# Patient Record
Sex: Female | Born: 2000 | Race: White | Hispanic: No | Marital: Single | Smoking: Never smoker
Health system: Southern US, Community
[De-identification: ages and names within clinical notes are randomized; demographics above are authoritative.]

## PROBLEM LIST (undated history)

## (undated) DIAGNOSIS — A749 Chlamydial infection, unspecified: Secondary | ICD-10-CM

## (undated) DIAGNOSIS — T7840XA Allergy, unspecified, initial encounter: Secondary | ICD-10-CM

## (undated) DIAGNOSIS — O139 Gestational [pregnancy-induced] hypertension without significant proteinuria, unspecified trimester: Secondary | ICD-10-CM

## (undated) DIAGNOSIS — O4290 Premature rupture of membranes, unspecified as to length of time between rupture and onset of labor, unspecified weeks of gestation: Secondary | ICD-10-CM

## (undated) DIAGNOSIS — N390 Urinary tract infection, site not specified: Secondary | ICD-10-CM

## (undated) DIAGNOSIS — N730 Acute parametritis and pelvic cellulitis: Secondary | ICD-10-CM

## (undated) DIAGNOSIS — IMO0002 Reserved for concepts with insufficient information to code with codable children: Secondary | ICD-10-CM

## (undated) HISTORY — DX: Acute parametritis and pelvic cellulitis: N73.0

## (undated) HISTORY — PX: COLONOSCOPY: SHX174

## (undated) HISTORY — DX: Chlamydial infection, unspecified: A74.9

## (undated) HISTORY — PX: WISDOM TOOTH EXTRACTION: SHX21

## (undated) HISTORY — DX: Gestational (pregnancy-induced) hypertension without significant proteinuria, unspecified trimester: O13.9

## (undated) HISTORY — PX: TONSILLECTOMY: SUR1361

---
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
## 2019-01-05 ENCOUNTER — Ambulatory Visit (INDEPENDENT_AMBULATORY_CARE_PROVIDER_SITE_OTHER): Payer: Medicaid Other | Admitting: Maternal Newborn

## 2019-01-05 ENCOUNTER — Encounter: Payer: Self-pay | Admitting: Maternal Newborn

## 2019-01-05 ENCOUNTER — Other Ambulatory Visit: Payer: Self-pay

## 2019-01-05 VITALS — BP 120/70 | Wt 214.0 lb

## 2019-01-05 DIAGNOSIS — Z3A33 33 weeks gestation of pregnancy: Secondary | ICD-10-CM

## 2019-01-05 DIAGNOSIS — Z3403 Encounter for supervision of normal first pregnancy, third trimester: Secondary | ICD-10-CM

## 2019-01-05 DIAGNOSIS — Z3402 Encounter for supervision of normal first pregnancy, second trimester: Secondary | ICD-10-CM

## 2019-01-05 LAB — POCT URINALYSIS DIPSTICK OB: Glucose, UA: NEGATIVE

## 2019-01-05 NOTE — Patient Instructions (Signed)
Third Trimester of Pregnancy The third trimester is from week 28 through week 40 (months 7 through 9). The third trimester is a time when the unborn baby (fetus) is growing rapidly. At the end of the ninth month, the fetus is about 20 inches in length and weighs 6-10 pounds. Body changes during your third trimester Your body will continue to go through many changes during pregnancy. The changes vary from woman to woman. During the third trimester:  Your weight will continue to increase. You can expect to gain 25-35 pounds (11-16 kg) by the end of the pregnancy.  You may begin to get stretch marks on your hips, abdomen, and breasts.  You may urinate more often because the fetus is moving lower into your pelvis and pressing on your bladder.  You may develop or continue to have heartburn. This is caused by increased hormones that slow down muscles in the digestive tract.  You may develop or continue to have constipation because increased hormones slow digestion and cause the muscles that push waste through your intestines to relax.  You may develop hemorrhoids. These are swollen veins (varicose veins) in the rectum that can itch or be painful.  You may develop swollen, bulging veins (varicose veins) in your legs.  You may have increased body aches in the pelvis, back, or thighs. This is due to weight gain and increased hormones that are relaxing your joints.  You may have changes in your hair. These can include thickening of your hair, rapid growth, and changes in texture. Some women also have hair loss during or after pregnancy, or hair that feels dry or thin. Your hair will most likely return to normal after your baby is born.  Your breasts will continue to grow and they will continue to become tender. A yellow fluid (colostrum) may leak from your breasts. This is the first milk you are producing for your baby.  Your belly button may stick out.  You may notice more swelling in your hands,  face, or ankles.  You may have increased tingling or numbness in your hands, arms, and legs. The skin on your belly may also feel numb.  You may feel short of breath because of your expanding uterus.  You may have more problems sleeping. This can be caused by the size of your belly, increased need to urinate, and an increase in your body's metabolism.  You may notice the fetus "dropping," or moving lower in your abdomen (lightening).  You may have increased vaginal discharge.  You may notice your joints feel loose and you may have pain around your pelvic bone. What to expect at prenatal visits You will have prenatal exams every 2 weeks until week 36. Then you will have weekly prenatal exams. During a routine prenatal visit:  You will be weighed to make sure you and the baby are growing normally.  Your blood pressure will be taken.  Your abdomen will be measured to track your baby's growth.  The fetal heartbeat will be listened to.  Any test results from the previous visit will be discussed.  You may have a cervical check near your due date to see if your cervix has softened or thinned (effaced).  You will be tested for Group B streptococcus. This happens between 35 and 37 weeks. Your health care provider may ask you:  What your birth plan is.  How you are feeling.  If you are feeling the baby move.  If you have had any abnormal   symptoms, such as leaking fluid, bleeding, severe headaches, or abdominal cramping.  If you are using any tobacco products, including cigarettes, chewing tobacco, and electronic cigarettes.  If you have any questions. Other tests or screenings that may be performed during your third trimester include:  Blood tests that check for low iron levels (anemia).  Fetal testing to check the health, activity level, and growth of the fetus. Testing is done if you have certain medical conditions or if there are problems during the pregnancy.  Nonstress test  (NST). This test checks the health of your baby to make sure there are no signs of problems, such as the baby not getting enough oxygen. During this test, a belt is placed around your belly. The baby is made to move, and its heart rate is monitored during movement. What is false labor? False labor is a condition in which you feel small, irregular tightenings of the muscles in the womb (contractions) that usually go away with rest, changing position, or drinking water. These are called Braxton Hicks contractions. Contractions may last for hours, days, or even weeks before true labor sets in. If contractions come at regular intervals, become more frequent, increase in intensity, or become painful, you should see your health care provider. What are the signs of labor?  Abdominal cramps.  Regular contractions that start at 10 minutes apart and become stronger and more frequent with time.  Contractions that start on the top of the uterus and spread down to the lower abdomen and back.  Increased pelvic pressure and dull back pain.  A watery or bloody mucus discharge that comes from the vagina.  Leaking of amniotic fluid. This is also known as your "water breaking." It could be a slow trickle or a gush. Let your health care provider know if it has a color or strange odor. If you have any of these signs, call your health care provider right away, even if it is before your due date. Follow these instructions at home: Medicines  Follow your health care provider's instructions regarding medicine use. Specific medicines may be either safe or unsafe to take during pregnancy.  Take a prenatal vitamin that contains at least 600 micrograms (mcg) of folic acid.  If you develop constipation, try taking a stool softener if your health care provider approves. Eating and drinking   Eat a balanced diet that includes fresh fruits and vegetables, whole grains, good sources of protein such as meat, eggs, or tofu,  and low-fat dairy. Your health care provider will help you determine the amount of weight gain that is right for you.  Avoid raw meat and uncooked cheese. These carry germs that can cause birth defects in the baby.  If you have low calcium intake from food, talk to your health care provider about whether you should take a daily calcium supplement.  Eat four or five small meals rather than three large meals a day.  Limit foods that are high in fat and processed sugars, such as fried and sweet foods.  To prevent constipation: ? Drink enough fluid to keep your urine clear or pale yellow. ? Eat foods that are high in fiber, such as fresh fruits and vegetables, whole grains, and beans. Activity  Exercise only as directed by your health care provider. Most women can continue their usual exercise routine during pregnancy. Try to exercise for 30 minutes at least 5 days a week. Stop exercising if you experience uterine contractions.  Avoid heavy lifting.  Do   not exercise in extreme heat or humidity, or at high altitudes.  Wear low-heel, comfortable shoes.  Practice good posture.  You may continue to have sex unless your health care provider tells you otherwise. Relieving pain and discomfort  Take frequent breaks and rest with your legs elevated if you have leg cramps or low back pain.  Take warm sitz baths to soothe any pain or discomfort caused by hemorrhoids. Use hemorrhoid cream if your health care provider approves.  Wear a good support bra to prevent discomfort from breast tenderness.  If you develop varicose veins: ? Wear support pantyhose or compression stockings as told by your healthcare provider. ? Elevate your feet for 15 minutes, 3-4 times a day. Prenatal care  Write down your questions. Take them to your prenatal visits.  Keep all your prenatal visits as told by your health care provider. This is important. Safety  Wear your seat belt at all times when driving.  Make  a list of emergency phone numbers, including numbers for family, friends, the hospital, and police and fire departments. General instructions  Avoid cat litter boxes and soil used by cats. These carry germs that can cause birth defects in the baby. If you have a cat, ask someone to clean the litter box for you.  Do not travel far distances unless it is absolutely necessary and only with the approval of your health care provider.  Do not use hot tubs, steam rooms, or saunas.  Do not drink alcohol.  Do not use any products that contain nicotine or tobacco, such as cigarettes and e-cigarettes. If you need help quitting, ask your health care provider.  Do not use any medicinal herbs or unprescribed drugs. These chemicals affect the formation and growth of the baby.  Do not douche or use tampons or scented sanitary pads.  Do not cross your legs for long periods of time.  To prepare for the arrival of your baby: ? Take prenatal classes to understand, practice, and ask questions about labor and delivery. ? Make a trial run to the hospital. ? Visit the hospital and tour the maternity area. ? Arrange for maternity or paternity leave through employers. ? Arrange for family and friends to take care of pets while you are in the hospital. ? Purchase a rear-facing car seat and make sure you know how to install it in your car. ? Pack your hospital bag. ? Prepare the baby's nursery. Make sure to remove all pillows and stuffed animals from the baby's crib to prevent suffocation.  Visit your dentist if you have not gone during your pregnancy. Use a soft toothbrush to brush your teeth and be gentle when you floss. Contact a health care provider if:  You are unsure if you are in labor or if your water has broken.  You become dizzy.  You have mild pelvic cramps, pelvic pressure, or nagging pain in your abdominal area.  You have lower back pain.  You have persistent nausea, vomiting, or diarrhea.   You have an unusual or bad smelling vaginal discharge.  You have pain when you urinate. Get help right away if:  Your water breaks before 37 weeks.  You have regular contractions less than 5 minutes apart before 37 weeks.  You have a fever.  You are leaking fluid from your vagina.  You have spotting or bleeding from your vagina.  You have severe abdominal pain or cramping.  You have rapid weight loss or weight gain.  You have   shortness of breath with chest pain.  You notice sudden or extreme swelling of your face, hands, ankles, feet, or legs.  Your baby makes fewer than 10 movements in 2 hours.  You have severe headaches that do not go away when you take medicine.  You have vision changes. Summary  The third trimester is from week 28 through week 40, months 7 through 9. The third trimester is a time when the unborn baby (fetus) is growing rapidly.  During the third trimester, your discomfort may increase as you and your baby continue to gain weight. You may have abdominal, leg, and back pain, sleeping problems, and an increased need to urinate.  During the third trimester your breasts will keep growing and they will continue to become tender. A yellow fluid (colostrum) may leak from your breasts. This is the first milk you are producing for your baby.  False labor is a condition in which you feel small, irregular tightenings of the muscles in the womb (contractions) that eventually go away. These are called Braxton Hicks contractions. Contractions may last for hours, days, or even weeks before true labor sets in.  Signs of labor can include: abdominal cramps; regular contractions that start at 10 minutes apart and become stronger and more frequent with time; watery or bloody mucus discharge that comes from the vagina; increased pelvic pressure and dull back pain; and leaking of amniotic fluid. This information is not intended to replace advice given to you by your health  care provider. Make sure you discuss any questions you have with your health care provider. Document Released: 05/01/2001 Document Revised: 08/28/2018 Document Reviewed: 06/12/2016 [employer] Patient Education  2020 [employer]  

## 2019-01-05 NOTE — Progress Notes (Signed)
    Routine Prenatal Care Visit  Subjective  Leslie Galvan is a 18 y.o. G1P0 at [redacted]w[redacted]d being seen today for ongoing prenatal care.  She is currently monitored for the following issues for this low-risk pregnancy and has Encounter for supervision of normal first pregnancy in second trimester on their problem list.  ----------------------------------------------------------------------------------- Patient reports no complaints.   Contractions: Not present. Vag. Bleeding: None.  Movement: Present. No leaking of fluid.  ----------------------------------------------------------------------------------- The following portions of the patient's history were reviewed and updated as appropriate: allergies, current medications, past family history, past medical history, past social history, past surgical history and problem list. Problem list updated.   Objective  Blood pressure 120/70, weight 214 lb (97.1 kg), last menstrual period 05/13/2018. Pregravid weight 170 lb (77.1 kg) Total Weight Gain 44 lb (20 kg) Urinalysis: Urine dipstick shows negative for glucose, positive for protein (trace). Fetal Status: Fetal Heart Rate (bpm): 144 Fundal Height: 34 cm Movement: Present     General:  Alert, oriented and cooperative. Patient is in no acute distress.  Skin: Skin is warm and dry. No rash noted.   Cardiovascular: Normal heart rate noted  Respiratory: Normal respiratory effort, no problems with respiration noted  Abdomen: Soft, gravid, appropriate for gestational age. Pain/Pressure: Absent     Pelvic:  Cervical exam deferred        Extremities: Normal range of motion.  Edema: None  Mental Status: Normal mood and affect. Normal behavior. Normal judgment and thought content.     Assessment   18 y.o. G1P0 at [redacted]w[redacted]d, EDD 02/17/2019 by Last Menstrual Period presenting for a routine prenatal visit.  Plan   Pregnancy #1 Problems (from 09/29/18 to present)    Problem Noted Resolved   Encounter for  supervision of normal first pregnancy in second trimester 09/29/2018 by Rexene Agent, CNM No   Overview Addendum 12/08/2018 10:20 AM by Rod Can, [place] Prenatal Labs  Dating L=8 Blood type: A/Positive/-- (01/28 0000)   Genetic Screen Normal NT scan per OB records, no AFP on file and patient declined 5/11 Antibody:Negative (01/28 0000)  Anatomic [place] Complete 7/6 Rubella: Immune (01/28 0000) Varicella:    GTT Third trimester:  RPR: Nonreactive (01/28 0000)   Rhogam N/A HBsAg: Negative (01/28 0000)   TDaP vaccine   Flu Shot: HIV: Neg (01/28 0000)   Baby Food Breast                         GBS:   Contraception Pill? Pap: N/A, less than 21  CBB     CS/VBAC NA   Support Person Twin sister Janan Halter                Please refer to After Visit Summary for other counseling recommendations.   Return in about 2 weeks (around 01/19/2019) for [place].  Avel Sensor, CNM 01/05/2019  11:25 AM

## 2019-01-19 ENCOUNTER — Encounter: Payer: Self-pay | Admitting: Obstetrics and Gynecology

## 2019-01-19 ENCOUNTER — Other Ambulatory Visit (HOSPITAL_COMMUNITY)
Admission: RE | Admit: 2019-01-19 | Discharge: 2019-01-19 | Disposition: A | Discharge status: Home / Self Care | Payer: Medicaid Other | Source: Ambulatory Visit | Attending: Obstetrics and Gynecology | Admitting: Obstetrics and Gynecology

## 2019-01-19 ENCOUNTER — Ambulatory Visit (INDEPENDENT_AMBULATORY_CARE_PROVIDER_SITE_OTHER): Payer: Medicaid Other | Admitting: Obstetrics and Gynecology

## 2019-01-19 ENCOUNTER — Observation Stay
Admission: EM | Admit: 2019-01-19 | Discharge: 2019-01-19 | Disposition: A | Discharge status: Home / Self Care | Payer: Medicaid Other | Attending: Obstetrics and Gynecology | Admitting: Obstetrics and Gynecology

## 2019-01-19 ENCOUNTER — Other Ambulatory Visit: Payer: Self-pay

## 2019-01-19 VITALS — BP 126/80 | Wt 215.0 lb

## 2019-01-19 DIAGNOSIS — O26853 Spotting complicating pregnancy, third trimester: Secondary | ICD-10-CM | POA: Insufficient documentation

## 2019-01-19 DIAGNOSIS — R1030 Lower abdominal pain, unspecified: Secondary | ICD-10-CM | POA: Insufficient documentation

## 2019-01-19 DIAGNOSIS — Z3A35 35 weeks gestation of pregnancy: Secondary | ICD-10-CM | POA: Insufficient documentation

## 2019-01-19 DIAGNOSIS — Z79899 Other long term (current) drug therapy: Secondary | ICD-10-CM | POA: Insufficient documentation

## 2019-01-19 DIAGNOSIS — O26843 Uterine size-date discrepancy, third trimester: Secondary | ICD-10-CM

## 2019-01-19 DIAGNOSIS — R300 Dysuria: Secondary | ICD-10-CM | POA: Diagnosis not present

## 2019-01-19 DIAGNOSIS — O26893 Other specified pregnancy related conditions, third trimester: Principal | ICD-10-CM

## 2019-01-19 DIAGNOSIS — R109 Unspecified abdominal pain: Secondary | ICD-10-CM

## 2019-01-19 DIAGNOSIS — M545 Low back pain: Secondary | ICD-10-CM | POA: Insufficient documentation

## 2019-01-19 DIAGNOSIS — Z3402 Encounter for supervision of normal first pregnancy, second trimester: Secondary | ICD-10-CM

## 2019-01-19 LAB — URINALYSIS, COMPLETE (UACMP) WITH MICROSCOPIC
Bilirubin Urine: NEGATIVE
Glucose, UA: NEGATIVE mg/dL
Ketones, ur: NEGATIVE mg/dL
Nitrite: NEGATIVE
Protein, ur: NEGATIVE mg/dL
Specific Gravity, Urine: 1.002 — ABNORMAL LOW (ref 1.005–1.030)
pH: 7 (ref 5.0–8.0)

## 2019-01-19 MED ORDER — CEPHALEXIN 500 MG PO CAPS: 500.0000 mg | ORAL_CAPSULE | Freq: Four times a day (QID) | ORAL | 0 refills | Status: AC

## 2019-01-19 MED ORDER — CEPHALEXIN 500 MG PO CAPS
500.0000 mg | ORAL_CAPSULE | Freq: Once | ORAL | Status: AC
  Administered 2019-01-19: 500 mg via ORAL
  Filled 2019-01-19: qty 1

## 2019-01-19 MED ORDER — PHENAZOPYRIDINE HCL 100 MG PO TABS
100.0000 mg | ORAL_TABLET | Freq: Once | ORAL | Status: AC
  Administered 2019-01-19: 100 mg via ORAL
  Filled 2019-01-19: qty 1

## 2019-01-19 NOTE — Progress Notes (Signed)
    Routine Prenatal Care Visit  Subjective  Leslie Galvan is a 18 y.o. G1P0 at [redacted]w[redacted]d being seen today for ongoing prenatal care.  She is currently monitored for the following issues for this low-risk pregnancy and has Encounter for supervision of normal first pregnancy in second trimester on their problem list.  ----------------------------------------------------------------------------------- Patient reports braxton hicks contractions 4-5 a day.   Contractions: Irregular. Vag. Bleeding: None.  Movement: Present. Denies leaking of fluid.  ----------------------------------------------------------------------------------- The following portions of the patient's history were reviewed and updated as appropriate: allergies, current medications, past family history, past medical history, past social history, past surgical history and problem list. Problem list updated.   Objective  Blood pressure 126/80, weight 215 lb (97.5 kg), last menstrual period 05/13/2018. Pregravid weight 170 lb (77.1 kg) Total Weight Gain 45 lb (20.4 kg) Urinalysis:      Fetal Status: Fetal Heart Rate (bpm): 140 Fundal Height: 38 cm Movement: Present     General:  Alert, oriented and cooperative. Patient is in no acute distress.  Skin: Skin is warm and dry. No rash noted.   Cardiovascular: Normal heart rate noted  Respiratory: Normal respiratory effort, no problems with respiration noted  Abdomen: Soft, gravid, appropriate for gestational age. Pain/Pressure: Present     Pelvic:  Cervical exam performed        Extremities: Normal range of motion.     Mental Status: Normal mood and affect. Normal behavior. Normal judgment and thought content.     Assessment   18 y.o. G1P0 at [redacted]w[redacted]d by  02/17/2019, by Last Menstrual Period presenting for routine prenatal visit  Plan   Pregnancy #1 Problems (from 09/29/18 to present)    Problem Noted Resolved   Encounter for supervision of normal first pregnancy in second  trimester 09/29/2018 by Rexene Agent, CNM No   Overview Addendum 12/08/2018 10:20 AM by Rod Can, [place] Prenatal Labs  Dating L=8 Blood type: A/Positive/-- (01/28 0000)   Genetic Screen Normal NT scan per OB records, no AFP on file and patient declined 5/11 Antibody:Negative (01/28 0000)  Anatomic [place] Complete 7/6 Rubella: Immune (01/28 0000) Varicella:    GTT Third trimester:  RPR: Nonreactive (01/28 0000)   Rhogam N/A HBsAg: Negative (01/28 0000)   TDaP vaccine   Flu Shot: HIV: Neg (01/28 0000)   Baby Food Breast                         GBS:   Contraception Pill? Pap: N/A, less than 21  CBB     CS/VBAC NA   Support Person Twin sister Janan Halter                 Gestational age appropriate obstetric precautions including but not limited to vaginal bleeding, contractions, leaking of fluid and fetal movement were reviewed in detail with the patient.    40 lb weight gain, Uterine size > dates, will obtain growth [place] Patient too uncomfortable with vaginal swabs for a SVE. Declined flu shot  Return in about 1 week (around 01/26/2019) for ROB and [place].  Homero Fellers MD [facility], [facility] 01/19/2019, 11:12 AM

## 2019-01-19 NOTE — Discharge Instructions (Signed)
LABOR: When contractions begin, you should start to time them from the beginning of one contraction to the beginning of the next.  When contractions are 5-10 minutes apart or less and have been regular for at least an hour, you should call your health care provider.  Notify your doctor if any of the following occur: 1. Bleeding from the vagina 7. Sudden, constant, or occasional abdominal pain  2. Pain or burning when urinating 8. Sudden gushing of fluid from the vagina (with or without continued leaking)  3. Chills or fever 9. Fainting spells, "black outs" or loss of consciousness  4. Increase in vaginal discharge 10. Severe or continued nausea or vomiting  5. Pelvic pressure (sudden increase) 11. Blurring of vision or spots before the eyes  6. Baby moving less than usual 12. Leaking of fluid    FETAL KICK COUNT: Lie on your left side for one hour after a meal, and count the number of times your baby kicks. If it is less than 5 times, get up, move around and drink some juice. Repeat the test 30 minutes later. If it is still less than 5 kicks in an hour, notify your doctor.Pregnancy and Urinary Tract Infection  A urinary tract infection (UTI) is an infection of any part of the urinary tract. This includes the kidneys, the tubes that connect your kidneys to your bladder (ureters), the bladder, and the tube that carries urine out of your body (urethra). These organs make, store, and get rid of urine in the body. Your health care provider may use other names to describe the infection. An upper UTI affects the ureters and kidneys (pyelonephritis). A lower UTI affects the bladder (cystitis) and urethra (urethritis). Most urinary tract infections are caused by bacteria in your genital area, around the entrance to your urinary tract (urethra). These bacteria grow and cause irritation and inflammation of your urinary tract. You are more likely to develop a UTI during pregnancy because the physical and hormonal  changes your body goes through can make it easier for bacteria to get into your urinary tract. Your growing baby also puts pressure on your bladder and can affect urine flow. It is important to recognize and treat UTIs in pregnancy because of the risk of serious complications for both you and your baby. How does this affect me? Symptoms of a UTI include:  Needing to urinate right away (urgently).  Frequent urination or passing small amounts of urine frequently.  Pain or burning with urination.  Blood in the urine.  Urine that smells bad or unusual.  Trouble urinating.  Cloudy urine.  Pain in the abdomen or lower back.  Vaginal discharge. You may also have:  Vomiting or a decreased appetite.  Confusion.  Irritability or tiredness.  A fever.  Diarrhea. How does this affect my baby? An untreated UTI during pregnancy could lead to a kidney infection or a systemic infection, which can cause health problems that could affect your baby. Possible complications of an untreated UTI include:  Giving birth to your baby before 37 weeks of pregnancy (premature).  Having a baby with a low birth weight.  Developing high blood pressure during pregnancy (preeclampsia).  Having a low hemoglobin level (anemia). What can I do to lower my risk? To prevent a UTI:  Go to the bathroom as soon as you feel the need. Do not hold urine for long periods of time.  Always wipe from front to back, especially after a bowel movement. Use each tissue  one time when you wipe.  Empty your bladder after sex.  Keep your genital area dry.  Drink 6-10 glasses of water each day.  Do not douche or use deodorant sprays. How is this treated? Treatment for this condition may include:  Antibiotic medicines that are safe to take during pregnancy.  Other medicines to treat less common causes of UTI. Follow these instructions at home:  If you were prescribed an antibiotic medicine, take it as told by  your health care provider. Do not stop using the antibiotic even if you start to feel better.  Keep all follow-up visits as told by your health care provider. This is important. Contact a health care provider if:  Your symptoms do not improve or they get worse.  You have abnormal vaginal discharge. Get help right away if you:  Have a fever.  Have nausea and vomiting.  Have back or side pain.  Feel contractions in your uterus.  Have lower belly pain.  Have a gush of fluid from your vagina.  Have blood in your urine. Summary  A urinary tract infection (UTI) is an infection of any part of the urinary tract, which includes the kidneys, ureters, bladder, and urethra.  Most urinary tract infections are caused by bacteria in your genital area, around the entrance to your urinary tract (urethra).  You are more likely to develop a UTI during pregnancy.  If you were prescribed an antibiotic medicine, take it as told by your health care provider. Do not stop using the antibiotic even if you start to feel better. This information is not intended to replace advice given to you by your health care provider. Make sure you discuss any questions you have with your health care provider. Document Released: 09/01/2010 Document Revised: 08/29/2018 Document Reviewed: 04/10/2018 Elsevier Patient Education  2020 [employer].

## 2019-01-19 NOTE — Discharge Summary (Signed)
Physician Final Progress Note  Patient ID: Leslie Galvan MRN: [id number] DOB/AGE: May 11, 2001 18 y.o.  Admit date: 01/19/2019 Admitting provider: Natale Milch, MD Discharge date: 01/19/2019   Admission Diagnoses: lower back and flank pain, vaginal spotting  Discharge Diagnoses:  Active Problems:   Indication for care in labor and delivery, antepartum   Dysuria during pregnancy in third trimester IUP at [redacted]w[redacted]d Reactive NST Presumptive UTI  History of Present Illness: The patient is a 18 y.o. female G1P0 at [redacted]w[redacted]d who presents for lower back and lower abdominal cramping that began around 12:00 PM today. She also has complaint of burning with urination along with frequency and urgency. She has noticed vaginal spotting for the past few days following intercourse on Friday. She admits good fetal movement. She feels tightness in her abdomen but denies pain with contractions. She denies leakage of fluid. The patient had GBS and Aptima swabs collected today in the office.   Will treat for presumptive UTI. May need to adjust antibiotic following Urine culture results.  Past Medical History:  Diagnosis Date  . Allergy   . Urinary tract infection     Past Surgical History:  Procedure Laterality Date  . COLONOSCOPY    . HYSTEROSCOPY W/D&C N/A 02/15/2017   Procedure: DILATATION AND CURETTAGE /HYSTEROSCOPY;  Surgeon: Christeen Douglas, MD;  Location: [facility];  Service: Gynecology;  Laterality: N/A;  . TONSILLECTOMY N/A 01/04/2016   Procedure: TONSILLECTOMY;  Surgeon: Bud Face, MD;  Location: [facility];  Service: ENT;  Laterality: N/A;  . TONSILLECTOMY    . WISDOM TOOTH EXTRACTION      No current facility-administered medications on file prior to encounter.    Current Outpatient Medications on File Prior to Encounter  Medication Sig Dispense Refill  . ferrous sulfate 325 (65 FE) MG tablet Take 325 mg by mouth daily with breakfast.    . Prenatal Vit-Fe Fumarate-FA  (MULTIVITAMIN-PRENATAL) 27-0.8 MG TABS tablet Take 1 tablet by mouth daily at 12 noon.      No Known Allergies  Social History   Socioeconomic History  . Marital status: Single    Spouse name: Not on file  . Number of children: Not on file  . Years of education: Not on file  . Highest education level: Not on file  Occupational History  . Not on file  Social Needs  . Financial resource strain: Not on file  . Food insecurity    Worry: Not on file    Inability: Not on file  . Transportation needs    Medical: Not on file    Non-medical: Not on file  Tobacco Use  . Smoking status: Never Smoker  . Smokeless tobacco: Never Used  Substance and Sexual Activity  . Alcohol use: Not Currently    Comment: UNSURE BUT GRANDMOTHER STATES PTS TWIN SISTER SAID SHE HAS DRANK ALCOHOL  . Drug use: Not Currently    Types: Marijuana    Comment: PER GRANDMOTHER CHERYL PER PTS TWIN SISTER  . Sexual activity: Not Currently    Birth control/protection: None  Lifestyle  . Physical activity    Days per week: Not on file    Minutes per session: Not on file  . Stress: Not on file  Relationships  . Social Musician on phone: Not on file    Gets together: Not on file    Attends religious service: Not on file    Active member of club or organization: Not on file  Attends meetings of clubs or organizations: Not on file    Relationship status: Not on file  . Intimate partner violence    Fear of current or ex partner: Patient refused    Emotionally abused: Patient refused    Physically abused: Patient refused    Forced sexual activity: Patient refused  Other Topics Concern  . Not on file  Social History Narrative  . Not on file    History reviewed. No pertinent family history.   Review of Systems  Constitutional: Negative.   HENT: Negative.   Eyes: Negative.   Respiratory: Negative.   Cardiovascular: Negative.   Gastrointestinal: Positive for abdominal pain.  Genitourinary:  Positive for dysuria, frequency and urgency.  Musculoskeletal: Positive for back pain.  Skin: Negative.   Neurological: Negative.   Endo/Heme/Allergies: Negative.   Psychiatric/Behavioral: Negative.      Physical Exam: Temp 98.3 F (36.8 C) (Oral)   Resp 16   Ht 5\' 8"  (1.727 m)   Wt 97.5 kg   LMP 05/13/2018 (Exact Date)   BMI 32.69 kg/m   Constitutional: Well nourished, well developed female in no acute distress.  HEENT: normal Skin: Warm and dry.  Cardiovascular: Regular rate and rhythm.   Extremity: no edema  Respiratory: Clear to auscultation bilateral. Normal respiratory effort Abdomen: FHT present Back: no CVAT Neuro: DTRs 2+, Cranial nerves grossly intact Psych: Alert and Oriented x3. No memory deficits. Normal mood and affect.  MS: normal gait, normal bilateral lower extremity ROM/strength/stability.  Pelvic exam: deferred Toco: every 2-6 minutes, mild to palpation Fetal well being: 135 bpm, moderate variability, +accelerations, -decelerations   Consults: None  Significant Findings/ Diagnostic Studies: labs:  Results for Leslie, Galvan (MRN [id number]) as of 01/19/2019 22:56  Ref. Range 01/19/2019 21:51  Appearance Latest Ref Range: CLEAR  HAZY (A)  Bilirubin Urine Latest Ref Range: NEGATIVE  NEGATIVE  Color, Urine Latest Ref Range: YELLOW  YELLOW (A)  Glucose, UA Latest Ref Range: NEGATIVE mg/dL NEGATIVE  Hgb urine dipstick Latest Ref Range: NEGATIVE  LARGE (A)  Ketones, ur Latest Ref Range: NEGATIVE mg/dL NEGATIVE  Leukocytes,Ua Latest Ref Range: NEGATIVE  LARGE (A)  Nitrite Latest Ref Range: NEGATIVE  NEGATIVE  pH Latest Ref Range: 5.0 - 8.0  7.0  Protein Latest Ref Range: NEGATIVE mg/dL NEGATIVE  Specific Gravity, Urine Latest Ref Range: 1.005 - 1.030  1.002 (L)  Bacteria, UA Latest Ref Range: NONE SEEN  MANY (A)  RBC / HPF Latest Ref Range: 0 - 5 RBC/hpf 0-5  Squamous Epithelial / LPF Latest Ref Range: 0 - 5  6-10  WBC, UA Latest Ref Range: 0 - 5 WBC/hpf  21-50    Procedures: NST  Hospital Course: The patient was admitted to Labor and Delivery Triage for observation.   Discharge Condition: good  Disposition: Discharge disposition: 01-Home or Self Care       Diet: Regular diet, increase hydration til urine is clear to light yellow  Discharge Activity: Activity as tolerated  Discharge Instructions    Discharge activity:  No Restrictions   Complete by: As directed    Discharge diet:  No restrictions   Complete by: As directed    Fetal Kick Count:  Lie on our left side for one hour after a meal, and count the number of times your baby kicks.  If it is less than 5 times, get up, move around and drink some juice.  Repeat the test 30 minutes later.  If it is still less than  5 kicks in an hour, notify your doctor.   Complete by: As directed    No sexual activity restrictions   Complete by: As directed    Notify physician for a general feeling that "something is not right"   Complete by: As directed    Notify physician for increase or change in vaginal discharge   Complete by: As directed    Notify physician for intestinal cramps, with or without diarrhea, sometimes described as "gas pain"   Complete by: As directed    Notify physician for leaking of fluid   Complete by: As directed    Notify physician for low, dull backache, unrelieved by heat or Tylenol   Complete by: As directed    Notify physician for menstrual like cramps   Complete by: As directed    Notify physician for pelvic pressure   Complete by: As directed    Notify physician for uterine contractions.  These may be painless and feel like the uterus is tightening or the baby is  "balling up"   Complete by: As directed    Notify physician for vaginal bleeding   Complete by: As directed    PRETERM LABOR:  Includes any of the follwing symptoms that occur between 20 - [redacted] weeks gestation.  If these symptoms are not stopped, preterm labor can result in preterm delivery,  placing your baby at risk   Complete by: As directed      Allergies as of 01/19/2019   No Known Allergies     Medication List    TAKE these medications   cephALEXin 500 MG capsule Commonly known as: KEFLEX Take 1 capsule (500 mg total) by mouth 4 (four) times daily for 7 days.   ferrous sulfate 325 (65 FE) MG tablet Take 325 mg by mouth daily with breakfast.   multivitamin-prenatal 27-0.8 MG Tabs tablet Take 1 tablet by mouth daily at 12 noon.      Follow-up Information    [facility] [place].   Contact information: [address] [place] [address] [phone]           Total time spent taking care of this patient: 15 minutes  Signed: Tresea MallJane Taj Arteaga, CNM  01/19/2019, 11:49 PM

## 2019-01-19 NOTE — Progress Notes (Signed)
ROB C/o pains all over stomach and back, having contractions Declines flu shot

## 2019-01-19 NOTE — OB Triage Note (Addendum)
Pt arrived to unit with complaints of lower back/flank pain 7/10 accompanied by lower abdominal cramping starting today at around lunch time. Pt also states that bloody urine with  a painful stinging while urinating and subsequently wiping starting this early afternoon- all starting immediately following her OB appointment. +FM. Denies LOF. Last intercourse on Friday night (01/16/2019). Continue to assess.

## 2019-01-21 ENCOUNTER — Other Ambulatory Visit: Payer: Self-pay | Admitting: Advanced Practice Midwife

## 2019-01-21 DIAGNOSIS — O26893 Other specified pregnancy related conditions, third trimester: Secondary | ICD-10-CM

## 2019-01-21 LAB — URINE CULTURE: Culture: <10000 — AB

## 2019-01-21 LAB — CERVICOVAGINAL ANCILLARY ONLY
Chlamydia: NEGATIVE
Neisseria Gonorrhea: NEGATIVE
Trichomonas: NEGATIVE

## 2019-01-21 MED ORDER — PHENAZOPYRIDINE HCL 100 MG PO TABS: 100.0000 mg | ORAL_TABLET | Freq: Three times a day (TID) | ORAL | 0 refills | Status: DC | PRN

## 2019-01-21 NOTE — Telephone Encounter (Signed)
Patient states she has been in pain for 2 days bc her rx for a UTI wasn't called in & she hasn't received a return call or message about it.

## 2019-01-21 NOTE — Progress Notes (Unsigned)
Rx pyridium sent to patient pharmacy.

## 2019-01-21 NOTE — Telephone Encounter (Signed)
Spoke w/patient. She was under the impression that she was getting 2 prescriptions and she only received one. Thought she was getting something to help with the pain.

## 2019-01-22 NOTE — Telephone Encounter (Signed)
Her urine culture was abnormal right?

## 2019-01-22 NOTE — Progress Notes (Signed)
WNL

## 2019-01-23 LAB — CULTURE, BETA STREP (GROUP B ONLY): Strep Gp B Culture: NEGATIVE

## 2019-01-23 NOTE — Progress Notes (Signed)
WNL

## 2019-01-27 ENCOUNTER — Encounter: Payer: Self-pay | Admitting: Advanced Practice Midwife

## 2019-01-27 ENCOUNTER — Other Ambulatory Visit: Payer: Self-pay

## 2019-01-27 ENCOUNTER — Ambulatory Visit (INDEPENDENT_AMBULATORY_CARE_PROVIDER_SITE_OTHER): Payer: Medicaid Other

## 2019-01-27 ENCOUNTER — Ambulatory Visit (INDEPENDENT_AMBULATORY_CARE_PROVIDER_SITE_OTHER): Payer: Medicaid Other | Admitting: Advanced Practice Midwife

## 2019-01-27 VITALS — BP 120/80 | Wt 219.0 lb

## 2019-01-27 DIAGNOSIS — Z3A37 37 weeks gestation of pregnancy: Secondary | ICD-10-CM | POA: Diagnosis not present

## 2019-01-27 DIAGNOSIS — Z3402 Encounter for supervision of normal first pregnancy, second trimester: Secondary | ICD-10-CM

## 2019-01-27 DIAGNOSIS — O26843 Uterine size-date discrepancy, third trimester: Secondary | ICD-10-CM | POA: Diagnosis not present

## 2019-01-27 NOTE — Progress Notes (Signed)
  Routine Prenatal Care Visit  Subjective  Leslie Galvan is a 18 y.o. G1P0 at [redacted]w[redacted]d being seen today for ongoing prenatal care.  She is currently monitored for the following issues for this low-risk pregnancy and has Encounter for supervision of normal first pregnancy in second trimester; Indication for care in labor and delivery, antepartum; and Dysuria during pregnancy in third trimester on their problem list.  ----------------------------------------------------------------------------------- Patient reports some braxton hicks and some abdominal and back pain.   Contractions: Not present. Vag. Bleeding: None.  Movement: Present. Leaking Fluid denies.  ----------------------------------------------------------------------------------- The following portions of the patient's history were reviewed and updated as appropriate: allergies, current medications, past family history, past medical history, past social history, past surgical history and problem list. Problem list updated.  Objective  Blood pressure 120/80, weight 219 lb (99.3 kg), last menstrual period 05/13/2018. Pregravid weight 170 lb (77.1 kg) Total Weight Gain 49 lb (22.2 kg) Urinalysis: Urine Protein    Urine Glucose    Fetal Status: Fetal Heart Rate (bpm): 132 Fundal Height: 40 cm Movement: Present  Presentation: Vertex  Growth Scan today: 67.3%, 7 pounds 4 ounces, AFI: 25.1 cm  General:  Alert, oriented and cooperative. Patient is in no acute distress.  Skin: Skin is warm and dry. No rash noted.   Cardiovascular: Normal heart rate noted  Respiratory: Normal respiratory effort, no problems with respiration noted  Abdomen: Soft, gravid, appropriate for gestational age. Pain/Pressure: Present     Pelvic:  Cervical exam deferred        Extremities: Normal range of motion.  Edema: Trace  Mental Status: Normal mood and affect. Normal behavior. Normal judgment and thought content.   Assessment   18 y.o. G1P0 at [redacted]w[redacted]d by   02/17/2019, by Last Menstrual Period presenting for routine prenatal visit  Plan   Pregnancy #1 Problems (from 09/29/18 to present)    Problem Noted Resolved   Dysuria during pregnancy in third trimester 01/19/2019 by Rod Can, CNM No   Encounter for supervision of normal first pregnancy in second trimester 09/29/2018 by Rexene Agent, CNM No   Overview Addendum 01/19/2019 11:14 AM by Homero Fellers, [place] Prenatal Labs  Dating L=8 Blood type: A/Positive/-- (01/28 0000)   Genetic Screen Normal NT scan per OB records, no AFP on file and patient declined 5/11 Antibody:Negative (01/28 0000)  Anatomic [place] Complete 7/6 Rubella: Immune (01/28 0000) Varicella:    GTT Third trimester:  RPR: Nonreactive (01/28 0000)   Rhogam N/A HBsAg: Negative (01/28 0000)   TDaP vaccine   Flu Shot:Declined HIV: Neg (01/28 0000)   Baby Food Breast                         GBS:   Contraception Pill? Pap: N/A, less than 21  CBB     CS/VBAC NA   Support Person Twin sister Janan Halter                 Preterm labor symptoms and general obstetric precautions including but not limited to vaginal bleeding, contractions, leaking of fluid and fetal movement were reviewed in detail with the patient.    Return in about 1 week (around 02/03/2019) for afi and rob.  Rod Can, CNM 01/27/2019 12:15 PM

## 2019-01-30 ENCOUNTER — Other Ambulatory Visit: Payer: Self-pay

## 2019-01-30 ENCOUNTER — Inpatient Hospital Stay
Admission: EM | Admit: 2019-01-30 | Discharge: 2019-02-01 | DRG: 806 | Disposition: A | Discharge status: Home / Self Care | Payer: Medicaid Other | Attending: Obstetrics & Gynecology | Admitting: Obstetrics & Gynecology

## 2019-01-30 DIAGNOSIS — O9081 Anemia of the puerperium: Secondary | ICD-10-CM | POA: Diagnosis not present

## 2019-01-30 DIAGNOSIS — R300 Dysuria: Secondary | ICD-10-CM

## 2019-01-30 DIAGNOSIS — Z20828 Contact with and (suspected) exposure to other viral communicable diseases: Secondary | ICD-10-CM | POA: Diagnosis present

## 2019-01-30 DIAGNOSIS — Z3A37 37 weeks gestation of pregnancy: Secondary | ICD-10-CM | POA: Diagnosis not present

## 2019-01-30 DIAGNOSIS — O134 Gestational [pregnancy-induced] hypertension without significant proteinuria, complicating childbirth: Principal | ICD-10-CM | POA: Diagnosis present

## 2019-01-30 DIAGNOSIS — D62 Acute posthemorrhagic anemia: Secondary | ICD-10-CM | POA: Diagnosis not present

## 2019-01-30 DIAGNOSIS — O4290 Premature rupture of membranes, unspecified as to length of time between rupture and onset of labor, unspecified weeks of gestation: Secondary | ICD-10-CM | POA: Diagnosis present

## 2019-01-30 DIAGNOSIS — O4292 Full-term premature rupture of membranes, unspecified as to length of time between rupture and onset of labor: Secondary | ICD-10-CM | POA: Diagnosis present

## 2019-01-30 DIAGNOSIS — O26893 Other specified pregnancy related conditions, third trimester: Secondary | ICD-10-CM | POA: Diagnosis present

## 2019-01-30 DIAGNOSIS — O4212 Full-term premature rupture of membranes, onset of labor more than 24 hours following rupture: Secondary | ICD-10-CM

## 2019-01-30 DIAGNOSIS — Z3402 Encounter for supervision of normal first pregnancy, second trimester: Secondary | ICD-10-CM

## 2019-01-30 HISTORY — DX: Premature rupture of membranes, unspecified as to length of time between rupture and onset of labor, unspecified weeks of gestation: O42.90

## 2019-01-30 LAB — WET PREP, GENITAL
Sperm: NONE SEEN
Trich, Wet Prep: NONE SEEN
Yeast Wet Prep HPF POC: NONE SEEN

## 2019-01-30 LAB — SARS CORONAVIRUS 2 BY RT PCR (HOSPITAL ORDER, PERFORMED IN ~~LOC~~ HOSPITAL LAB): SARS Coronavirus 2: NEGATIVE

## 2019-01-30 LAB — URINALYSIS, COMPLETE (UACMP) WITH MICROSCOPIC
Bilirubin Urine: NEGATIVE
Glucose, UA: NEGATIVE mg/dL
Hgb urine dipstick: NEGATIVE
Ketones, ur: NEGATIVE mg/dL
Leukocytes,Ua: NEGATIVE
Nitrite: NEGATIVE
Protein, ur: NEGATIVE mg/dL
Specific Gravity, Urine: 1.008 (ref 1.005–1.030)
pH: 6 (ref 5.0–8.0)

## 2019-01-30 LAB — COMPREHENSIVE METABOLIC PANEL
ALT: 12 U/L (ref 0–44)
AST: 21 U/L (ref 15–41)
Albumin: 3.1 g/dL — ABNORMAL LOW (ref 3.5–5.0)
Alkaline Phosphatase: 161 U/L — ABNORMAL HIGH (ref 38–126)
Anion gap: 10 (ref 5–15)
BUN: 5 mg/dL — ABNORMAL LOW (ref 6–20)
CO2: 19 mmol/L — ABNORMAL LOW (ref 22–32)
Calcium: 9.2 mg/dL (ref 8.9–10.3)
Chloride: 106 mmol/L (ref 98–111)
Creatinine, Ser: 0.58 mg/dL (ref 0.44–1.00)
GFR calc Af Amer: >60 mL/min (ref >60)
GFR calc non Af Amer: >60 mL/min (ref >60)
Glucose, Bld: 73 mg/dL (ref 70–99)
Potassium: 3.5 mmol/L (ref 3.5–5.1)
Sodium: 135 mmol/L (ref 135–145)
Total Bilirubin: 0.5 mg/dL (ref 0.3–1.2)
Total Protein: 6.8 g/dL (ref 6.5–8.1)

## 2019-01-30 LAB — CBC
HCT: 37.2 % (ref 36.0–46.0)
Hemoglobin: 13 g/dL (ref 12.0–15.0)
MCH: 31.9 pg (ref 26.0–34.0)
MCHC: 34.9 g/dL (ref 30.0–36.0)
MCV: 91.2 fL (ref 80.0–100.0)
Platelets: 207 10*3/uL (ref 150–400)
RBC: 4.08 MIL/uL (ref 3.87–5.11)
RDW: 13.4 % (ref 11.5–15.5)
WBC: 7.8 10*3/uL (ref 4.0–10.5)
nRBC: 0 % (ref 0.0–0.2)

## 2019-01-30 LAB — PROTEIN / CREATININE RATIO, URINE
Creatinine, Urine: 102 mg/dL
Protein Creatinine Ratio: 0.13 mg/mg{Cre} (ref 0.00–0.15)
Total Protein, Urine: 13 mg/dL

## 2019-01-30 LAB — TYPE AND SCREEN
ABO/RH(D): A POS
Antibody Screen: NEGATIVE

## 2019-01-30 LAB — RUPTURE OF MEMBRANE (ROM)PLUS: Rom Plus: POSITIVE

## 2019-01-30 MED ORDER — ACETAMINOPHEN 325 MG PO TABS: 650.0000 mg | ORAL_TABLET | ORAL | Status: DC | PRN

## 2019-01-30 MED ORDER — LACTATED RINGERS IV SOLN: 500.0000 mL | INTRAVENOUS | Status: DC | PRN

## 2019-01-30 MED ORDER — OXYTOCIN 10 UNIT/ML IJ SOLN
INTRAMUSCULAR | Status: AC
  Filled 2019-01-30: qty 2

## 2019-01-30 MED ORDER — OXYTOCIN 40 UNITS IN NORMAL SALINE INFUSION - SIMPLE MED
1.0000 m[IU]/min | INTRAVENOUS | Status: DC
  Administered 2019-01-30: 2 m[IU]/min via INTRAVENOUS
  Filled 2019-01-30 (×2): qty 1000

## 2019-01-30 MED ORDER — OXYTOCIN BOLUS FROM INFUSION: 500.0000 mL | Freq: Once | INTRAVENOUS | Status: DC

## 2019-01-30 MED ORDER — ONDANSETRON HCL 4 MG/2ML IJ SOLN: 4.0000 mg | Freq: Four times a day (QID) | INTRAMUSCULAR | Status: DC | PRN

## 2019-01-30 MED ORDER — LIDOCAINE HCL (PF) 1 % IJ SOLN: 30.0000 mL | INTRAMUSCULAR | Status: DC | PRN

## 2019-01-30 MED ORDER — MISOPROSTOL 100 MCG PO TABS
25.0000 ug | ORAL_TABLET | ORAL | Status: DC | PRN
  Filled 2019-01-30 (×2): qty 1

## 2019-01-30 MED ORDER — LACTATED RINGERS IV SOLN
INTRAVENOUS | Status: DC
  Administered 2019-01-30 – 2019-01-31 (×3): via INTRAVENOUS

## 2019-01-30 MED ORDER — OXYTOCIN 40 UNITS IN NORMAL SALINE INFUSION - SIMPLE MED
2.5000 [IU]/h | INTRAVENOUS | Status: DC
  Filled 2019-01-30: qty 1000

## 2019-01-30 MED ORDER — MISOPROSTOL 200 MCG PO TABS
ORAL_TABLET | ORAL | Status: AC
  Filled 2019-01-30: qty 4

## 2019-01-30 MED ORDER — BUTORPHANOL TARTRATE 1 MG/ML IJ SOLN
1.0000 mg | INTRAMUSCULAR | Status: DC | PRN
  Administered 2019-01-31: 09:00:00 1 mg via INTRAVENOUS
  Filled 2019-01-30: qty 1

## 2019-01-30 MED ORDER — LIDOCAINE HCL (PF) 1 % IJ SOLN
INTRAMUSCULAR | Status: AC
  Filled 2019-01-30: qty 30

## 2019-01-30 MED ORDER — AMMONIA AROMATIC IN INHA
RESPIRATORY_TRACT | Status: AC
  Filled 2019-01-30: qty 10

## 2019-01-30 MED ORDER — TERBUTALINE SULFATE 1 MG/ML IJ SOLN: 0.2500 mg | Freq: Once | INTRAMUSCULAR | Status: DC | PRN

## 2019-01-30 NOTE — OB Triage Note (Signed)
Pt is a G1P0 at [redacted]w[redacted]d that presents from the ED with c/o LOF since yesterday around 0800. Pt states sh ehad has [employer] ctx for a couple weeks but today they are more painful. Pt states ctx are not consistent and come and go but rate about 8/10 when they happen. Pt denies VB and states positive FM. Fetal monitors applied and assessing with initial FHT 145. Will continue to monitor.

## 2019-01-30 NOTE — H&P (Addendum)
OB History & Physical   History of Present Illness:  Chief Complaint: leaking fluid  HPI:  Leslie Galvan is a 18 y.o. G1P0 female at [redacted]w[redacted]d dated by LMP.  Her pregnancy has been uncomplicated.    She admits occasional contractions.   She reports leakage of fluid since yesterday morning that has soaked through a number of pairs of underwear.   She denies vaginal bleeding.   She reports fetal movement.    Total weight gain for pregnancy: 22.2 kg   Obstetrical Problem List: Pregnancy #1 Problems (from 09/29/18 to present)    Problem Noted Resolved   Dysuria during pregnancy in third trimester 01/19/2019 by Rod Can, CNM No   Encounter for supervision of normal first pregnancy in second trimester 09/29/2018 by Rexene Agent, CNM No   Overview Addendum 01/19/2019 11:14 AM by Homero Fellers, [place] Prenatal Labs  Dating L=8 Blood type: A/Positive/-- (01/28 0000)   Genetic Screen Normal NT scan per OB records, no AFP on file and patient declined 5/11 Antibody:Negative (01/28 0000)  Anatomic [place] Complete 7/6 Rubella: Immune (01/28 0000) Varicella:    GTT Third trimester:  RPR: Nonreactive (01/28 0000)   Rhogam N/A HBsAg: Negative (01/28 0000)   TDaP vaccine   Flu Shot:Declined HIV: Neg (01/28 0000)   Baby Food Breast                         GBS:   Contraception Pill? Pap: N/A, less than 21  CBB     CS/VBAC NA   Support Person Twin sister [place]                 Maternal Medical History:   Past Medical History:  Diagnosis Date  . Allergy   . Medical history non-contributory   . Urinary tract infection     Past Surgical History:  Procedure Laterality Date  . COLONOSCOPY    . HYSTEROSCOPY W/D&C N/A 02/15/2017   Procedure: DILATATION AND CURETTAGE /HYSTEROSCOPY;  Surgeon: Benjaman Kindler, MD;  Location: [facility] ORS;  Service: Gynecology;  Laterality: N/A;  . TONSILLECTOMY N/A 01/04/2016   Procedure: TONSILLECTOMY;  Surgeon: Carloyn Manner, MD;   Location: [place];  Service: ENT;  Laterality: N/A;  . TONSILLECTOMY    . WISDOM TOOTH EXTRACTION      No Known Allergies  Prior to Admission medications   Medication Sig Start Date End Date Taking? Authorizing Provider  ferrous sulfate 325 (65 FE) MG tablet Take 325 mg by mouth daily with breakfast.   Yes [provider]  Prenatal Vit-Fe Fumarate-FA (MULTIVITAMIN-PRENATAL) 27-0.8 MG TABS tablet Take 1 tablet by mouth daily at 12 noon.   Yes [provider]  phenazopyridine (PYRIDIUM) 100 MG tablet Take 1 tablet (100 mg total) by mouth 3 (three) times daily as needed for pain. Patient not taking: Reported on 01/30/2019 01/21/19   Rod Can, CNM    OB History  Gravida Para Term Preterm AB Living  1            SAB TAB Ectopic Multiple Live Births               # Outcome Date GA Lbr Len/2nd Weight Sex Delivery Anes PTL Lv  1 Current             Prenatal care site: [facility]  Social History: She  reports that she has never smoked. She has never used smokeless  tobacco. She reports previous alcohol use. She reports previous drug use. Drug: Marijuana.  Family History: family history is not on file.    Review of Systems:  Review of Systems  Constitutional: Negative.   HENT: Negative.   Eyes: Negative.   Respiratory: Negative.   Cardiovascular: Negative.   Gastrointestinal: Negative.   Genitourinary: Negative.   Musculoskeletal: Negative.   Skin: Negative.   Neurological: Negative.   Endo/Heme/Allergies: Negative.   Psychiatric/Behavioral: Negative.      Physical Exam:  BP (!) 145/90 (BP Location: Right Arm)   Pulse 68   Temp 98.2 F (36.8 C) (Oral)   Resp 16   Ht 5\' 8"  (1.727 m)   Wt 99.3 kg   LMP 05/13/2018 (Exact Date)   BMI 33.30 kg/m   Constitutional: Well nourished, well developed female in no acute distress.  HEENT: normal Skin: Warm and dry.  Cardiovascular: Regular rate and rhythm.   Extremity: no edema   Respiratory: Clear to auscultation bilateral. Normal respiratory effort Abdomen: FHT present Back: no CVAT Neuro: DTRs 2+, Cranial nerves grossly intact Psych: Alert and Oriented x3. No memory deficits. Normal mood and affect.  MS: normal gait, normal bilateral lower extremity ROM/strength/stability.  Pelvic exam: (female chaperone present) is not limited by body habitus EGBUS: within normal limits Vagina: within normal limits and with normal mucosa  Cervix: 3/60-70/-2  Data:   Sterile speculum exam: small amount of pooling clear fluid, also present thin white discharge Nitrazine negative, Ferning negative, ROM plus positive  Results for Leslie Galvan, [name] (MRN [id number]) as of 01/30/2019 22:56  Ref. Range 01/30/2019 17:40 01/30/2019 19:47 01/30/2019 20:25 01/30/2019 20:39  COMPREHENSIVE METABOLIC PANEL Unknown    Rpt (A)  Sodium Latest Ref Range: 135 - 145 mmol/L    135  Potassium Latest Ref Range: 3.5 - 5.1 mmol/L    3.5  Chloride Latest Ref Range: 98 - 111 mmol/L    106  CO2 Latest Ref Range: 22 - 32 mmol/L    19 (L)  Glucose Latest Ref Range: 70 - 99 mg/dL    73  BUN Latest Ref Range: 6 - 20 mg/dL    5 (L)  Creatinine Latest Ref Range: 0.44 - 1.00 mg/dL    4.090.58  Calcium Latest Ref Range: 8.9 - 10.3 mg/dL    9.2  Anion gap Latest Ref Range: 5 - 15     10  Alkaline Phosphatase Latest Ref Range: 38 - 126 U/L    161 (H)  Albumin Latest Ref Range: 3.5 - 5.0 g/dL    3.1 (L)  AST Latest Ref Range: 15 - 41 U/L    21  ALT Latest Ref Range: 0 - 44 U/L    12  Total Protein Latest Ref Range: 6.5 - 8.1 g/dL    6.8  Total Bilirubin Latest Ref Range: 0.3 - 1.2 mg/dL    0.5  GFR, Est Non African American Latest Ref Range: >60 mL/min    >60  GFR, Est African American Latest Ref Range: >60 mL/min    >60  WBC Latest Ref Range: 4.0 - 10.5 K/uL    7.8  RBC Latest Ref Range: 3.87 - 5.11 MIL/uL    4.08  Hemoglobin Latest Ref Range: 12.0 - 15.0 g/dL    81.113.0  HCT Latest Ref Range: 36.0 - 46.0 %    37.2   MCV Latest Ref Range: 80.0 - 100.0 fL    91.2  MCH Latest Ref Range: 26.0 - 34.0 pg  31.9  MCHC Latest Ref Range: 30.0 - 36.0 g/dL    10.1  RDW Latest Ref Range: 11.5 - 15.5 %    13.4  Platelets Latest Ref Range: 150 - 400 K/uL    207  nRBC Latest Ref Range: 0.0 - 0.2 %    0.0  Sample Expiration Unknown    02/02/2019,2359...  Antibody Screen Unknown    NEG  ABO/RH(D) Unknown    A POS  SARS CORONAVIRUS 2 (HOSPITAL ORDER, PERFORMED IN [facility]) Unknown   Rpt   Yeast Wet Prep HPF POC Latest Ref Range: NONE SEEN  NONE SEEN     Trich, Wet Prep Latest Ref Range: NONE SEEN  NONE SEEN     Clue Cells Wet Prep HPF POC Latest Ref Range: NONE SEEN  PRESENT (A)     WBC, Wet Prep HPF POC Latest Ref Range: NONE SEEN  MODERATE (A)     Appearance Latest Ref Range: CLEAR   HAZY (A)    Bilirubin Urine Latest Ref Range: NEGATIVE   NEGATIVE    Color, Urine Latest Ref Range: YELLOW   YELLOW (A)    Glucose, UA Latest Ref Range: NEGATIVE mg/dL  NEGATIVE    Hgb urine dipstick Latest Ref Range: NEGATIVE   NEGATIVE    Ketones, ur Latest Ref Range: NEGATIVE mg/dL  NEGATIVE    Leukocytes,Ua Latest Ref Range: NEGATIVE   NEGATIVE    Nitrite Latest Ref Range: NEGATIVE   NEGATIVE    pH Latest Ref Range: 5.0 - 8.0   6.0    Protein Latest Ref Range: NEGATIVE mg/dL  NEGATIVE    Specific Gravity, Urine Latest Ref Range: 1.005 - 1.030   1.008    Bacteria, UA Latest Ref Range: NONE SEEN   MANY (A)    Mucus Unknown  PRESENT    RBC / HPF Latest Ref Range: 0 - 5 RBC/hpf  0-5    Squamous Epithelial / LPF Latest Ref Range: 0 - 5   0-5    WBC, UA Latest Ref Range: 0 - 5 WBC/hpf  0-5    Total Protein, Urine Latest Units: mg/dL  13    Protein Creatinine Ratio Latest Ref Range: 0.00 - 0.15 mg/mgCre  0.13    Creatinine, Urine Latest Units: mg/dL  751    WET PREP, GENITAL Unknown Rpt (A)     Rom Plus Unknown POSITIVE       Pertinent Results:  Prenatal Labs Blood type/Rh A positive  Antibody screen negative   Rubella Immune  Varicella Unknown    RPR Non-reactive  HBsAg negative  HIV negative  GC negative  Chlamydia negative  Genetic screening NT scan normal  1 hour GTT 123  3 hour GTT NA  GBS negative on 8/31   Baseline FHR: 135 beats/min   Variability: moderate   Accelerations: present   Decelerations: absent Contractions: present frequency: every 3-6 minutes Overall assessment: reassuring  Bedside Ultrasound:  Number of Fetus: 1  Presentation: cephalic  Fluid: adequate    Lab Results  Component Value Date   SARSCOV2NAA NEGATIVE 01/30/2019  ]  Assessment:  Leslie Galvan is a 18 y.o. G1P0 female at [redacted]w[redacted]d with equivocal spontaneous rupture of membranes.    Labs and tests are not in agreement regarding rupture of membranes but given the possibility- especially due to the positive ROM Plus test and that she is full term, the patient is counseled that it is best to stay inpatient and augment/induce labor  rather than letting her discharge to home. All questions answered and the patient agrees with the plan of care.  Plan:  1. Admit to Labor & Delivery  2. CBC, CMP, PC ratio, T&S, Clrs, IVF 3. GBS negative.   4. Fetal well-being: Category I 5. Begin induction with pitocin  Tresea MallJane Prisilla Kocsis, CNM 01/30/2019 10:21 PM

## 2019-01-31 ENCOUNTER — Inpatient Hospital Stay: Payer: Medicaid Other | Admitting: Anesthesiology

## 2019-01-31 DIAGNOSIS — Z3A37 37 weeks gestation of pregnancy: Secondary | ICD-10-CM

## 2019-01-31 LAB — RPR: RPR Ser Ql: NONREACTIVE

## 2019-01-31 MED ORDER — BENZOCAINE-MENTHOL 20-0.5 % EX AERO
1.0000 "application " | INHALATION_SPRAY | CUTANEOUS | Status: DC | PRN
  Filled 2019-01-31 (×2): qty 56

## 2019-01-31 MED ORDER — SIMETHICONE 80 MG PO CHEW: 80.0000 mg | CHEWABLE_TABLET | ORAL | Status: DC | PRN

## 2019-01-31 MED ORDER — SODIUM CHLORIDE 0.9 % IV SOLN
2.0000 g | Freq: Once | INTRAVENOUS | Status: AC
  Administered 2019-01-31: 11:00:00 2 g via INTRAVENOUS
  Filled 2019-01-31: qty 2000

## 2019-01-31 MED ORDER — LIDOCAINE HCL (PF) 1 % IJ SOLN
INTRAMUSCULAR | Status: AC
  Filled 2019-01-31: qty 30

## 2019-01-31 MED ORDER — OXYTOCIN 10 UNIT/ML IJ SOLN
INTRAMUSCULAR | Status: AC
  Filled 2019-01-31: qty 2

## 2019-01-31 MED ORDER — DIBUCAINE (PERIANAL) 1 % EX OINT
1.0000 "application " | TOPICAL_OINTMENT | CUTANEOUS | Status: DC | PRN
  Filled 2019-01-31: qty 28

## 2019-01-31 MED ORDER — ONDANSETRON HCL 4 MG PO TABS: 4.0000 mg | ORAL_TABLET | ORAL | Status: DC | PRN

## 2019-01-31 MED ORDER — FENTANYL 2.5 MCG/ML W/ROPIVACAINE 0.15% IN NS 100 ML EPIDURAL (ARMC)
EPIDURAL | Status: AC
  Filled 2019-01-31: qty 100

## 2019-01-31 MED ORDER — OXYCODONE HCL 5 MG PO TABS
5.0000 mg | ORAL_TABLET | ORAL | Status: DC | PRN
  Administered 2019-01-31 – 2019-02-01 (×2): 5 mg via ORAL
  Filled 2019-01-31 (×2): qty 1

## 2019-01-31 MED ORDER — AMMONIA AROMATIC IN INHA
RESPIRATORY_TRACT | Status: AC
  Filled 2019-01-31: qty 10

## 2019-01-31 MED ORDER — COCONUT OIL OIL
1.0000 "application " | TOPICAL_OIL | Status: DC | PRN
  Administered 2019-02-01: 1 via TOPICAL
  Filled 2019-01-31: qty 120

## 2019-01-31 MED ORDER — CARBOPROST TROMETHAMINE 250 MCG/ML IM SOLN
INTRAMUSCULAR | Status: AC
  Administered 2019-01-31: 250 ug
  Filled 2019-01-31: qty 1

## 2019-01-31 MED ORDER — LIDOCAINE-EPINEPHRINE (PF) 1.5 %-1:200000 IJ SOLN
INTRAMUSCULAR | Status: DC | PRN
  Administered 2019-01-31: 3 mL via PERINEURAL

## 2019-01-31 MED ORDER — SENNOSIDES-DOCUSATE SODIUM 8.6-50 MG PO TABS
2.0000 | ORAL_TABLET | ORAL | Status: DC
  Administered 2019-01-31: 2 via ORAL
  Filled 2019-01-31: qty 2

## 2019-01-31 MED ORDER — ACETAMINOPHEN 325 MG PO TABS
650.0000 mg | ORAL_TABLET | ORAL | Status: DC | PRN
  Administered 2019-02-01 (×3): 650 mg via ORAL
  Filled 2019-01-31 (×3): qty 2

## 2019-01-31 MED ORDER — FERROUS SULFATE 325 (65 FE) MG PO TABS
325.0000 mg | ORAL_TABLET | Freq: Every day | ORAL | Status: DC
  Administered 2019-02-01: 325 mg via ORAL
  Filled 2019-01-31 (×2): qty 1

## 2019-01-31 MED ORDER — IBUPROFEN 600 MG PO TABS
600.0000 mg | ORAL_TABLET | Freq: Four times a day (QID) | ORAL | Status: DC
  Administered 2019-01-31: 600 mg via ORAL
  Administered 2019-01-31: 17:00:00 800 mg via ORAL
  Filled 2019-01-31 (×2): qty 1

## 2019-01-31 MED ORDER — MISOPROSTOL 200 MCG PO TABS
ORAL_TABLET | ORAL | Status: AC
  Administered 2019-01-31: 800 ug via VAGINAL
  Filled 2019-01-31: qty 4

## 2019-01-31 MED ORDER — WITCH HAZEL-GLYCERIN EX PADS
1.0000 "application " | MEDICATED_PAD | CUTANEOUS | Status: DC | PRN
  Filled 2019-01-31 (×2): qty 100

## 2019-01-31 MED ORDER — IBUPROFEN 800 MG PO TABS
ORAL_TABLET | ORAL | Status: AC
  Administered 2019-01-31: 17:00:00 800 mg via ORAL
  Filled 2019-01-31: qty 1

## 2019-01-31 MED ORDER — LIDOCAINE HCL (PF) 1 % IJ SOLN
INTRAMUSCULAR | Status: DC | PRN
  Administered 2019-01-31: 6 mL
  Administered 2019-01-31: 3 mL via INTRADERMAL

## 2019-01-31 MED ORDER — PRENATAL MULTIVITAMIN CH
1.0000 | ORAL_TABLET | Freq: Every day | ORAL | Status: DC
  Administered 2019-02-01: 1 via ORAL
  Filled 2019-01-31: qty 1

## 2019-01-31 MED ORDER — FENTANYL 2.5 MCG/ML W/ROPIVACAINE 0.15% IN NS 100 ML EPIDURAL (ARMC)
EPIDURAL | Status: DC | PRN
  Administered 2019-01-31 (×2): 12 mL/h via EPIDURAL

## 2019-01-31 MED ORDER — BENZOCAINE-MENTHOL 20-0.5 % EX AERO
INHALATION_SPRAY | CUTANEOUS | Status: AC
  Administered 2019-01-31: 17:00:00
  Filled 2019-01-31: qty 56

## 2019-01-31 MED ORDER — DIPHENOXYLATE-ATROPINE 2.5-0.025 MG PO TABS
ORAL_TABLET | ORAL | Status: AC
  Administered 2019-01-31: 16:00:00 1
  Filled 2019-01-31: qty 1

## 2019-01-31 MED ORDER — SODIUM CHLORIDE 0.9 % IV SOLN
1.0000 g | INTRAVENOUS | Status: DC
  Filled 2019-01-31 (×5): qty 1000

## 2019-01-31 MED ORDER — ONDANSETRON HCL 4 MG/2ML IJ SOLN: 4.0000 mg | INTRAMUSCULAR | Status: DC | PRN

## 2019-01-31 MED ORDER — TETANUS-DIPHTH-ACELL PERTUSSIS 5-2.5-18.5 LF-MCG/0.5 IM SUSP
0.5000 mL | Freq: Once | INTRAMUSCULAR | Status: AC
  Administered 2019-02-01: 0.5 mL via INTRAMUSCULAR
  Filled 2019-01-31: qty 0.5

## 2019-01-31 NOTE — Anesthesia Preprocedure Evaluation (Signed)
Anesthesia Evaluation  Patient identified by MRN, date of birth, ID band Patient awake    Reviewed: Allergy & Precautions, H&P , NPO status , Patient's Chart, lab work & pertinent test results, reviewed documented beta blocker date and time   Airway Mallampati: III  TM Distance: <3 FB Neck ROM: full    Dental  (+) Chipped   Pulmonary neg pulmonary ROS,    Pulmonary exam normal        Cardiovascular Exercise Tolerance: Good negative cardio ROS Normal cardiovascular exam     Neuro/Psych negative neurological ROS  negative psych ROS   GI/Hepatic negative GI ROS, Neg liver ROS,   Endo/Other  negative endocrine ROS  Renal/GU negative Renal ROS  negative genitourinary   Musculoskeletal negative musculoskeletal ROS (+)   Abdominal   Peds  Hematology negative hematology ROS (+)   Anesthesia Other Findings Day of surgery medications reviewed with the patient.  Reproductive/Obstetrics negative OB ROS                             Anesthesia Physical Anesthesia Plan  ASA: II and emergent  Anesthesia Plan: Epidural   Post-op Pain Management:    Induction: Intravenous  PONV Risk Score and Plan: Treatment may vary due to age or medical condition  Airway Management Planned:   Additional Equipment:   Intra-op Plan:   Post-operative Plan: Extubation in OR  Informed Consent: I have reviewed the patients History and Physical, chart, labs and discussed the procedure including the risks, benefits and alternatives for the proposed anesthesia with the patient or authorized representative who has indicated his/her understanding and acceptance.     Dental advisory given  Plan Discussed with: CRNA  Anesthesia Plan Comments:         Anesthesia Quick Evaluation

## 2019-01-31 NOTE — Progress Notes (Addendum)
L&D Progress Note   S: Desires epidural for pain   O: BP (!) 141/90 (BP Location: Left Arm)   Pulse 73   Temp 98 F (36.7 C) (Oral)   Resp 16   Ht 5\' 8"  (1.727 m)   Wt 99.3 kg   LMP 05/13/2018 (Exact Date)   BMI 33.29 kg/m   General: appears uncomfortable, moving around in bed with contractions  Abdomen: tender with contractions, ?LOA/ EFW 6#14oz  FHR: 130 with accelerations to 150, moderate variability Toco: contractions every 2-4 minutes? Difficulty picking up with external toco. Pitocin at 15 miu/min  Cervix: 4-5 cm/80-90%/-1  A: S/P SROM with Pitocin induction/augmentation- no change in cervical dilation in 3 hours Cat 1 tracing Mild range blood pressures with normal Preeclampsia labs-GHTN  P: Epidural Plan IUPC insertion after comfortable to better titrate Pitocin.  Dalia Heading, CNM

## 2019-01-31 NOTE — Discharge Summary (Signed)
Physician Obstetric Discharge Summary  Patient ID: Leslie Galvan MRN: [id number] DOB/AGE: 19/08/2000 17 y.o.   Date of Admission: 01/30/2019 Date of Delivery: 01/31/2019 Delivering Provider: Farrel Connersolleen Florence Antonelli, CNM Date of Discharge: 02/01/2019  Admitting Diagnosis: Premature rupture of membrane at 4089w4d  Secondary Diagnosis: gestational hypertension  Mode of Delivery: normal spontaneous vaginal delivery 01/31/2019      Discharge Diagnosis: Gestational hypertension and Post partum hemorrhage. Term intrauterine pregnancy delivered. Prolonged premature rupture of membranes. Fetal heart rate decelerations. Fetal bradycardia   Intrapartum Procedures: epidural, pitocin augmentation, placement of intrauterine catheter and repair of second degree perineal laceration and right labial laceration   Post partum procedures: TDAP  Complications: postpartum hemorrhage   Brief Hospital Course  Leslie Galvan is a G1P1001 who had a SVD on 01/31/2019 complicated by a postpartum hemorrhage.;  for further details of this delivery, please refer to the delivery note. Her hemoglobin decreased from 13 gm/dl on admission to 7.8GN/FA9.8gm/dl on the first postpartum day. She was asymptomatic and hemodynamically stable. Otherwise she had an uncomplicated postpartum course.  By time of discharge on PPD#1, her pain was controlled on oral pain medications; she had appropriate lochia and was ambulating, voiding without difficulty and tolerating regular diet. Although she had mild range blood pressure elevations during labor, all of her blood pressures postpartum were normal. She requested discharge on PPD #1 and she was deemed stable for discharge to home.  She will return to office on 16 September for a blood pressure check.   Labs: CBC Latest Ref Rng & Units 02/01/2019 01/30/2019 11/24/2018  WBC 4.0 - 10.5 K/uL 10.8(H) 7.8 7.3  Hemoglobin 12.0 - 15.0 g/dL 2.1(H9.8(L) 08.613.0 57.811.6  Hematocrit 36.0 - 46.0 % 29.1(L) 37.2 33.5(L)  Platelets  150 - 400 K/uL 152 207 209   A POS/ RI  Physical exam:  Blood pressure 121/83, pulse 94, temperature 98 F (36.7 C), temperature source Oral, resp. rate 18, height 5\' 8"  (1.727 m), weight 99.3 kg, last menstrual period 05/13/2018, SpO2 100 %, unknown if currently breastfeeding. General: alert and no distress Lochia: appropriate Abdomen: soft, NT Uterine Fundus: firm Vulva/lacerations healing well, no significant drainage, no dehiscence, no significant erythema, labia minora are swollen.  Extremities: No evidence of DVT seen on physical exam. No lower extremity edema.  Discharge Instructions: Per After Visit Summary. Activity: Advance as tolerated. Pelvic rest for 6 weeks.  Also refer to Discharge Instructions Diet: Regular Medications: Allergies as of 02/01/2019   No Known Allergies     Medication List    STOP taking these medications   phenazopyridine 100 MG tablet Commonly known as: Pyridium     TAKE these medications   acetaminophen 325 MG tablet Commonly known as: Tylenol Take 2 tablets (650 mg total) by mouth every 4 (four) hours as needed for mild pain, moderate pain or headache (for pain scale < 4).   ferrous sulfate 325 (65 FE) MG tablet Take 325 mg by mouth daily with breakfast.   ibuprofen 600 MG tablet Commonly known as: ADVIL Take 1 tablet (600 mg total) by mouth every 6 (six) hours as needed.   multivitamin-prenatal 27-0.8 MG Tabs tablet Take 1 tablet by mouth daily at 12 noon.   norethindrone 0.35 MG tablet Commonly known as: MICRONOR Take 1 tablet (0.35 mg total) by mouth daily. Start taking on: March 01, 2019      Outpatient follow up:  Follow-up Information    Farrel ConnersGutierrez, Sherion Dooly, CNM. Go on 02/04/2019.   Specialty: Certified Nurse  Midwife Why: for blood pressure check at 1400 (2PM) Contact information: [address] [place] [address] [phone]          Postpartum contraception: oral progesterone-only  contraceptive  Discharged Condition: stable  Discharged to: home   Newborn Data:[name] 7#4oz Disposition:home with mother  Apgars: APGAR (1 MIN): 7   APGAR (5 MINS): 9   APGAR (10 MINS):    Baby Feeding: Breast  Dalia Heading, [place] 02/01/2019 4:56 PM

## 2019-01-31 NOTE — Discharge Instructions (Signed)
°Vaginal Delivery, Care After °Refer to this sheet in the next few weeks. These discharge instructions provide you with information on caring for yourself after delivery. Your caregiver may also give you specific instructions. Your treatment has been planned according to the most current medical practices available, but problems sometimes occur. Call your caregiver if you have any problems or questions after you go home. °HOME CARE INSTRUCTIONS °1. Take over-the-counter or prescription medicines only as directed by your caregiver or pharmacist. °2. Do not drink alcohol, especially if you are breastfeeding or taking medicine to relieve pain. °3. Do not smoke tobacco. °4. Continue to use good perineal care. Good perineal care includes: °1. Wiping your perineum from back to front °2. Keeping your perineum clean. °3. You can do sitz baths twice a day, to help keep this area clean °5. Do not use tampons, douche or have sex for 6 weeks °6. Shower only and avoid sitting in submerged water, aside from sitz baths °7. Wear a well-fitting bra that provides breast support. °8. Eat healthy foods. °9. Drink enough fluids to keep your urine clear or pale yellow. °10. Eat high-fiber foods such as whole grain cereals and breads, brown rice, beans, and fresh fruits and vegetables every day. These foods may help prevent or relieve constipation. °11. Avoid constipation with high fiber foods or medications, such as miralax or metamucil °12. Follow your caregiver's recommendations regarding resumption of activities such as climbing stairs, driving, lifting, exercising, or traveling. °13. Talk to your caregiver about resuming sexual activities. Resumption of sexual activities after 6 weeks is dependent upon your risk of infection, your rate of healing, and your comfort and desire to resume sexual activity. °14. Try to have someone help you with your household activities and your newborn for at least a few days after you leave the  hospital. °15. Rest as much as possible. Try to rest or take a nap when your newborn is sleeping. °16. Increase your activities gradually. °17. Keep all of your scheduled postpartum appointments. It is very important to keep your scheduled follow-up appointments. At these appointments, your caregiver will be checking to make sure that you are healing physically and emotionally. °SEEK MEDICAL CARE IF:  °· You are passing large clots from your vagina. Save any clots to show your caregiver. °· You have a foul smelling discharge from your vagina. °· You have trouble urinating. °· You are urinating frequently. °· You have pain when you urinate. °· You have a change in your bowel movements. °· You have increasing redness, pain, or swelling near your vaginal incision (episiotomy) or vaginal tear. °· You have pus draining from your episiotomy or vaginal tear. °· Your episiotomy or vaginal tear is separating. °· You have painful, hard, or reddened breasts. °· You have a severe headache. °· You have blurred vision or see spots. °· You feel sad or depressed. °· You have thoughts of hurting yourself or your newborn. °· You have questions about your care, the care of your newborn, or medicines. °· You are dizzy or light-headed. °· You have a rash. °· You have nausea or vomiting. °· You were breastfeeding and have not had a menstrual period within 12 weeks after you stopped breastfeeding. °· You are not breastfeeding and have not had a menstrual period by the 12th week after delivery. °· You have a fever of 100.5 or more °SEEK IMMEDIATE MEDICAL CARE IF:  °· You have persistent pain. °· You have chest pain. °· You have shortness   of breath. °· You faint. °· You have leg pain. °· You have stomach pain. °· Your vaginal bleeding saturates two or more sanitary pads in 1 hour. °MAKE SURE YOU:  °· Understand these instructions. °· Will watch your condition. °· Will get help right away if you are not doing well or get worse. °Document  Released: 05/04/2000 Document Revised: 09/21/2013 Document Reviewed: 01/02/2012 °ExitCare® Patient Information ©2015 [employer]. This information is not intended to replace advice given to you by your health care provider. Make sure you discuss any questions you have with your health care provider. ° °Sitz Bath °A sitz bath is a warm water bath taken in the sitting position. The water covers only the hips and butt (buttocks). We recommend using one that fits in the toilet, to help with ease of use and cleanliness. It may be used for either healing or cleaning purposes. Sitz baths are also used to relieve pain, itching, or muscle tightening (spasms). The water may contain medicine. Moist heat will help you heal and relax.  °HOME CARE  °Take 3 to 4 sitz baths a day. °18. Fill the bathtub half-full with warm water. °19. Sit in the water and open the drain a little. °20. Turn on the warm water to keep the tub half-full. Keep the water running constantly. °21. Soak in the water for 15 to 20 minutes. °22. After the sitz bath, pat the affected area dry. °GET HELP RIGHT AWAY IF: °You get worse instead of better. Stop the sitz baths if you get worse. °MAKE SURE YOU: °· Understand these instructions. °· Will watch your condition. °· Will get help right away if you are not doing well or get worse. °Document Released: 06/14/2004 Document Revised: 01/30/2012 Document Reviewed: 09/04/2010 °ExitCare® Patient Information ©2015 [employer]. This information is not intended to replace advice given to you by your health care provider. Make sure you discuss any questions you have with your health care provider. ° ° °

## 2019-01-31 NOTE — Progress Notes (Addendum)
L&D Progress Note   S: Feeling more comfortable after epidural  O: BP (!) 102/52   Pulse 72   Temp 98 F (36.7 C) (Oral)   Resp 16   Ht 5\' 8"  (1.727 m)   Wt 99.3 kg   LMP 05/13/2018 (Exact Date)   SpO2 99%   BMI 33.29 kg/m    Cervix remains 4.5/80-90%/-1 IUPC inserted after which there was repetitive variable decelerations to 70s to 80s from baseline of 110s to 120. Placed on left then right side and amnioinfusion begun. [place] units were 140 with contractions every 2-4 minutes apart and Pitocin is on 8 miu/min. Decelerations persisted and Isabellarose was placed sitting up and is currently the FHR is looking better.  A: CAt 2 tracing Inadequate labor Prolonged rupture of membranes  P: Continue amnioinfusion at 50 ml/hr after 300 ml bolus Continue to monitor/ try different positions Ampicillin for prolonged ROM. INcrease Pitocin if tolerated by fetus  Dalia Heading, CNM

## 2019-01-31 NOTE — Anesthesia Procedure Notes (Addendum)
Epidural Patient location during procedure: OB Start time: 01/31/2019 8:46 AM End time: 01/31/2019 9:11 AM  Staffing Anesthesiologist: Alphonsus Sias, MD Performed: anesthesiologist   Preanesthetic Checklist Completed: patient identified, site marked, surgical consent, pre-op evaluation, timeout performed, IV checked, risks and benefits discussed and monitors and equipment checked  Epidural Patient position: sitting Prep: Betadine and DuraPrep Patient monitoring: heart rate, cardiac monitor, continuous pulse ox and blood pressure Approach: right paramedian Location: L2-L3 Injection technique: LOR saline  Needle:  Needle type: Tuohy  Needle gauge: 17 G Needle length: 15 cm and 9 Needle insertion depth: 6 cm Catheter type: closed end flexible Catheter size: 20 Guage Catheter at skin depth: 12 cm Test dose: negative and 1.5% lidocaine with Epi 1:200 K  Assessment Sensory level: T9 Events: blood not aspirated, injection not painful, no injection resistance, negative IV test and no paresthesia  Additional Notes   Patient tolerated the insertion well without complications.Reason for block:at surgeon's request and procedure for pain

## 2019-02-01 ENCOUNTER — Encounter: Payer: Self-pay | Admitting: Certified Nurse Midwife

## 2019-02-01 LAB — CBC
HCT: 29.1 % — ABNORMAL LOW (ref 36.0–46.0)
Hemoglobin: 9.8 g/dL — ABNORMAL LOW (ref 12.0–15.0)
MCH: 31.5 pg (ref 26.0–34.0)
MCHC: 33.7 g/dL (ref 30.0–36.0)
MCV: 93.6 fL (ref 80.0–100.0)
Platelets: 152 10*3/uL (ref 150–400)
RBC: 3.11 MIL/uL — ABNORMAL LOW (ref 3.87–5.11)
RDW: 13.6 % (ref 11.5–15.5)
WBC: 10.8 10*3/uL — ABNORMAL HIGH (ref 4.0–10.5)
nRBC: 0 % (ref 0.0–0.2)

## 2019-02-01 MED ORDER — IBUPROFEN 600 MG PO TABS
600.0000 mg | ORAL_TABLET | Freq: Four times a day (QID) | ORAL | Status: DC
  Administered 2019-02-01 (×3): 600 mg via ORAL
  Filled 2019-02-01 (×3): qty 1

## 2019-02-01 MED ORDER — NORETHINDRONE 0.35 MG PO TABS: 1.0000 | ORAL_TABLET | Freq: Every day | ORAL | 11 refills | Status: DC

## 2019-02-01 MED ORDER — IBUPROFEN 600 MG PO TABS: 600.0000 mg | ORAL_TABLET | Freq: Four times a day (QID) | ORAL | 1 refills | Status: DC | PRN

## 2019-02-01 MED ORDER — ACETAMINOPHEN 325 MG PO TABS: 650.0000 mg | ORAL_TABLET | ORAL | Status: DC | PRN

## 2019-02-01 NOTE — Progress Notes (Signed)
Post Partum Day 1; Gestational hypertension. S/P SVD and PPH Subjective: up ad lib, voiding and tolerating PO. Having some cramping. Bleeding slowing. Baby latching on breasts, but falling asleep. To work with Science writer.   Objective: Blood pressure 128/77, pulse 80, temperature 98.5 F (36.9 C), temperature source Oral, resp. rate 18, height 5\' 8"  (1.727 m), weight 99.3 kg, last menstrual period 05/13/2018, SpO2 98 %, unknown if currently breastfeeding. Blood pressures normal since delivery Physical Exam:  General: alert, cooperative and no distress Lochia: appropriate, small rubra Uterine Fundus: firm at U/ML/NT External vulva:  Swollen labia minora R>L, perineum intact with no edema. DVT Evaluation: No evidence of DVT seen on physical exam.  Recent Labs    01/30/19 2039 02/01/19 0654  HGB 13.0 9.8*  HCT 37.2 29.1*  WBC 7.8 10.8*  PLT 207 152    Assessment/Plan: Stable PPD #1-continue postpartum care  May want to be discharged this afternoon/eveing  Will need BP check in 2-3 days if discharged. Gestational hypertension-normotensive  Needs recheck of BP in 2-3 days   Mild anemia from acute blood loss  Continue vitamins and iron  D/C saline lock A POS/ RI Breast TDAP-offer at discharge Contraception: ?pill    LOS: 2 days   Dalia Heading 02/01/2019, 2:03 PM
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
## 2019-02-01 NOTE — Progress Notes (Signed)
Reviewed D/C instructions with pt and family. Pt verbalized understanding of teaching. Discharged to home via W/C. Pt to schedule f/u appt. On Wednesday.

## 2019-02-01 NOTE — Anesthesia Postprocedure Evaluation (Signed)
Anesthesia Post Note  Patient: Leslie Galvan  Procedure(s) Performed: AN AD HOC LABOR EPIDURAL  Patient location during evaluation: Mother Baby Anesthesia Type: Epidural Level of consciousness: awake and alert Pain management: pain level controlled Vital Signs Assessment: post-procedure vital signs reviewed and stable Respiratory status: spontaneous breathing, nonlabored ventilation and respiratory function stable Cardiovascular status: stable Postop Assessment: no headache, no backache and epidural receding Anesthetic complications: no     Last Vitals:  Vitals:   02/01/19 0811 02/01/19 1612  BP: 128/77 121/83  Pulse: 80 94  Resp: 18 18  Temp: 36.9 C 36.7 C  SpO2: 98% 100%    Last Pain:  Vitals:   02/01/19 1612  TempSrc: Oral  PainSc:                  Alphonsus Sias

## 2019-02-02 ENCOUNTER — Encounter: Payer: Medicaid Other | Admitting: Maternal Newborn

## 2019-02-02 ENCOUNTER — Ambulatory Visit: Payer: Medicaid Other

## 2019-02-02 ENCOUNTER — Telehealth: Payer: Self-pay | Admitting: Obstetrics and Gynecology

## 2019-02-02 NOTE — Telephone Encounter (Signed)
delivered

## 2019-02-02 NOTE — Telephone Encounter (Signed)
Patient is schedule today for AFI. Please advise office visit or telephone visit.

## 2019-02-04 ENCOUNTER — Other Ambulatory Visit: Payer: Self-pay

## 2019-02-04 ENCOUNTER — Emergency Department
Admission: EM | Admit: 2019-02-04 | Discharge: 2019-02-04 | Disposition: A | Discharge status: Home / Self Care | Payer: Medicaid Other | Attending: Emergency Medicine | Admitting: Emergency Medicine

## 2019-02-04 ENCOUNTER — Encounter: Payer: Self-pay | Admitting: Certified Nurse Midwife

## 2019-02-04 ENCOUNTER — Ambulatory Visit (INDEPENDENT_AMBULATORY_CARE_PROVIDER_SITE_OTHER): Payer: Medicaid Other | Admitting: Certified Nurse Midwife

## 2019-02-04 VITALS — BP 140/80 | HR 60 | Ht 68.0 in | Wt 204.6 lb

## 2019-02-04 DIAGNOSIS — O1490 Unspecified pre-eclampsia, unspecified trimester: Secondary | ICD-10-CM | POA: Diagnosis not present

## 2019-02-04 DIAGNOSIS — Z79899 Other long term (current) drug therapy: Secondary | ICD-10-CM | POA: Insufficient documentation

## 2019-02-04 DIAGNOSIS — I10 Essential (primary) hypertension: Secondary | ICD-10-CM | POA: Diagnosis present

## 2019-02-04 DIAGNOSIS — M7989 Other specified soft tissue disorders: Secondary | ICD-10-CM | POA: Insufficient documentation

## 2019-02-04 DIAGNOSIS — O165 Unspecified maternal hypertension, complicating the puerperium: Secondary | ICD-10-CM | POA: Insufficient documentation

## 2019-02-04 DIAGNOSIS — O139 Gestational [pregnancy-induced] hypertension without significant proteinuria, unspecified trimester: Secondary | ICD-10-CM | POA: Insufficient documentation

## 2019-02-04 LAB — HEPATIC FUNCTION PANEL
ALT: 17 U/L (ref 0–44)
AST: 26 U/L (ref 15–41)
Albumin: 3.1 g/dL — ABNORMAL LOW (ref 3.5–5.0)
Alkaline Phosphatase: 109 U/L (ref 38–126)
Bilirubin, Direct: <0.1 mg/dL (ref 0.0–0.2)
Total Bilirubin: 0.5 mg/dL (ref 0.3–1.2)
Total Protein: 6.5 g/dL (ref 6.5–8.1)

## 2019-02-04 LAB — LACTATE DEHYDROGENASE: LDH: 159 U/L (ref 98–192)

## 2019-02-04 LAB — BASIC METABOLIC PANEL
Anion gap: 10 (ref 5–15)
BUN: 6 mg/dL (ref 6–20)
CO2: 24 mmol/L (ref 22–32)
Calcium: 9.1 mg/dL (ref 8.9–10.3)
Chloride: 105 mmol/L (ref 98–111)
Creatinine, Ser: 0.53 mg/dL (ref 0.44–1.00)
GFR calc Af Amer: >60 mL/min (ref >60)
GFR calc non Af Amer: >60 mL/min (ref >60)
Glucose, Bld: 82 mg/dL (ref 70–99)
Potassium: 3.5 mmol/L (ref 3.5–5.1)
Sodium: 139 mmol/L (ref 135–145)

## 2019-02-04 LAB — URINALYSIS, ROUTINE W REFLEX MICROSCOPIC
Bacteria, UA: NONE SEEN
Bilirubin Urine: NEGATIVE
Glucose, UA: NEGATIVE mg/dL
Ketones, ur: NEGATIVE mg/dL
Nitrite: NEGATIVE
Protein, ur: NEGATIVE mg/dL
RBC / HPF: >50 RBC/hpf — ABNORMAL HIGH (ref 0–5)
Specific Gravity, Urine: 1.009 (ref 1.005–1.030)
pH: 8 (ref 5.0–8.0)

## 2019-02-04 LAB — CBC
HCT: 33.1 % — ABNORMAL LOW (ref 36.0–46.0)
Hemoglobin: 11 g/dL — ABNORMAL LOW (ref 12.0–15.0)
MCH: 31 pg (ref 26.0–34.0)
MCHC: 33.2 g/dL (ref 30.0–36.0)
MCV: 93.2 fL (ref 80.0–100.0)
Platelets: 258 10*3/uL (ref 150–400)
RBC: 3.55 MIL/uL — ABNORMAL LOW (ref 3.87–5.11)
RDW: 13.4 % (ref 11.5–15.5)
WBC: 8.3 10*3/uL (ref 4.0–10.5)
nRBC: 0 % (ref 0.0–0.2)

## 2019-02-04 LAB — URIC ACID: Uric Acid, Serum: 6.8 mg/dL (ref 2.5–7.1)

## 2019-02-04 LAB — BRAIN NATRIURETIC PEPTIDE: B Natriuretic Peptide: 69 pg/mL (ref 0.0–100.0)

## 2019-02-04 LAB — MAGNESIUM: Magnesium: 1.8 mg/dL (ref 1.7–2.4)

## 2019-02-04 MED ORDER — LABETALOL HCL 100 MG PO TABS: 200.0000 mg | ORAL_TABLET | Freq: Two times a day (BID) | ORAL | 0 refills | Status: DC

## 2019-02-04 MED ORDER — ACETAMINOPHEN 500 MG PO TABS
1000.0000 mg | ORAL_TABLET | Freq: Once | ORAL | Status: AC
  Administered 2019-02-04: 1000 mg via ORAL
  Filled 2019-02-04: qty 2

## 2019-02-04 MED ORDER — LABETALOL HCL 5 MG/ML IV SOLN: 5.0000 mg | Freq: Once | INTRAVENOUS | Status: DC

## 2019-02-04 MED ORDER — LABETALOL HCL 5 MG/ML IV SOLN
5.0000 mg | Freq: Once | INTRAVENOUS | Status: AC
  Administered 2019-02-04: 5 mg via INTRAVENOUS
  Filled 2019-02-04: qty 4

## 2019-02-04 MED ORDER — LABETALOL HCL 5 MG/ML IV SOLN: 10.0000 mg | Freq: Once | INTRAVENOUS | Status: DC

## 2019-02-04 NOTE — ED Provider Notes (Signed)
[facility] Emergency Department Provider Note  ____________________________________________   First MD Initiated Contact with Patient 02/04/19 1621     (approximate)  I have reviewed the triage vital signs and the nursing notes.   HISTORY  Chief Complaint Postpartum Complications and Hypertension    HPI Leslie Galvan is a 18 y.o. female from [facility] who went in for a scheduled appointment due to complications in pregnancy.  Patient gave birth 4 days ago.  Patient was sent here due to hypertension, severe leg swelling as well as headache.  Patient said that she delivered at 37 weeks by vaginal delivery.  She denies any complications except for some low blood pressures during delivery.  When she had a follow-up with her OB she was hypertensive and was sent here for pre-eclampsia work-up.  Denies eclampsia previously.  Patient noticed increased swelling of her bilateral ankles that started after delivery that has been constant, nothing makes it better, nothing makes it worse.  As well as a headache.  Denies any blurry vision.  Denies any shortness of breath.    Past Medical History:  Diagnosis Date  . Allergy   . Medical history non-contributory   . Urinary tract infection     Patient Active Problem List   Diagnosis Date Noted  . Postpartum care following vaginal delivery 01/31/2019  . Postpartum hemorrhage 01/31/2019  . Premature rupture of membranes 01/30/2019  . Indication for care in labor and delivery, antepartum 01/19/2019  . Dysuria during pregnancy in third trimester 01/19/2019  . Encounter for supervision of normal first pregnancy in second trimester 09/29/2018    Past Surgical History:  Procedure Laterality Date  . COLONOSCOPY    . HYSTEROSCOPY W/D&C N/A 02/15/2017   Procedure: DILATATION AND CURETTAGE /HYSTEROSCOPY;  Surgeon: Christeen Douglas, MD;  Location: [facility] ORS;  Service: Gynecology;  Laterality: N/A;  . TONSILLECTOMY N/A  01/04/2016   Procedure: TONSILLECTOMY;  Surgeon: Bud Face, MD;  Location: [facility];  Service: ENT;  Laterality: N/A;  . TONSILLECTOMY    . WISDOM TOOTH EXTRACTION      Prior to Admission medications   Medication Sig Start Date End Date Taking? Authorizing Provider  acetaminophen (TYLENOL) 325 MG tablet Take 2 tablets (650 mg total) by mouth every 4 (four) hours as needed for mild pain, moderate pain or headache (for pain scale < 4). 02/01/19   Farrel Conners, CNM  ferrous sulfate 325 (65 FE) MG tablet Take 325 mg by mouth daily with breakfast.    [provider]  ibuprofen (ADVIL) 600 MG tablet Take 1 tablet (600 mg total) by mouth every 6 (six) hours as needed. 02/01/19   Farrel Conners, CNM  norethindrone (MICRONOR) 0.35 MG tablet Take 1 tablet (0.35 mg total) by mouth daily. Patient not taking: Reported on 02/04/2019 03/01/19   Farrel Conners, CNM  Prenatal Vit-Fe Fumarate-FA (MULTIVITAMIN-PRENATAL) 27-0.8 MG TABS tablet Take 1 tablet by mouth daily at 12 noon.    [provider]    Allergies Patient has no known allergies.  No family history on file.  Social History Social History   Tobacco Use  . Smoking status: Never Smoker  . Smokeless tobacco: Never Used  Substance Use Topics  . Alcohol use: Not Currently    Comment: UNSURE BUT GRANDMOTHER STATES PTS TWIN SISTER SAID SHE HAS DRANK ALCOHOL  . Drug use: Not Currently    Types: Marijuana    Comment: PER GRANDMOTHER CHERYL PER PTS TWIN SISTER  Review of Systems Constitutional: No fever/chills Eyes: No blurry vision ENT: No sore throat. Cardiovascular: Denies chest pain. Respiratory: Denies shortness of breath. Gastrointestinal: No abdominal pain.  No nausea, no vomiting.  No diarrhea.  No constipation. Genitourinary: Negative for dysuria. Musculoskeletal: Negative for back pain.  Ankle swelling Skin: Negative for rash. Neurological: Positive headache focal weakness or  numbness. All other ROS negative ____________________________________________   PHYSICAL EXAM:  VITAL SIGNS: ED Triage Vitals [02/04/19 1609]  Enc Vitals Group     BP (!) 140/99     Pulse Rate 63     Resp 16     Temp 99 F (37.2 C)     Temp Source Oral     SpO2 100 %     Weight 204 lb (92.5 kg)     Height 5\' 8"  (1.727 m)     Head Circumference      Peak Flow      Pain Score 7     Pain Loc      Pain Edu?      Excl. in [place]?     Constitutional: Alert and oriented. Well appearing and in no acute distress. Eyes: Conjunctivae are normal. EOMI. Head: Atraumatic. Nose: No congestion/rhinnorhea. Mouth/Throat: Mucous membranes are moist.   Neck: No stridor. Trachea Midline. FROM Cardiovascular: Normal rate, regular rhythm. Grossly normal heart sounds.  Good peripheral circulation. Respiratory: Normal respiratory effort.  No retractions. Lungs CTAB. Gastrointestinal: Soft and nontender. No distention. No abdominal bruits.  Musculoskeletal: Trace pitting edema in her bilateral ankles but not in her legs. Neurologic:  Normal speech and language. No gross focal neurologic deficits are appreciated.  Skin:  Skin is warm, dry and intact. No rash noted. Psychiatric: Mood and affect are normal. Speech and behavior are normal. GU: Deferred   ____________________________________________   LABS (all labs ordered are listed, but only abnormal results are displayed)  Labs Reviewed  CBC - Abnormal; Notable for the following components:      Result Value   RBC 3.55 (*)    Hemoglobin 11.0 (*)    HCT 33.1 (*)    All other components within normal limits  HEPATIC FUNCTION PANEL - Abnormal; Notable for the following components:   Albumin 3.1 (*)    All other components within normal limits  URINALYSIS, ROUTINE W REFLEX MICROSCOPIC - Abnormal; Notable for the following components:   Color, Urine STRAW (*)    APPearance CLEAR (*)    Hgb urine dipstick MODERATE (*)    Leukocytes,Ua SMALL  (*)    RBC / HPF >50 (*)    All other components within normal limits  BASIC METABOLIC PANEL  LACTATE DEHYDROGENASE  MAGNESIUM  URIC ACID  BRAIN NATRIURETIC PEPTIDE   ____________________________________________  PROCEDURES  Procedure(s) performed (including Critical Care):  Procedures   ____________________________________________   INITIAL IMPRESSION / ASSESSMENT AND PLAN / ED COURSE  SILA SARSFIELD was evaluated in Emergency Department on 02/04/2019 for the symptoms described in the history of present illness. She was evaluated in the context of the global COVID-19 pandemic, which necessitated consideration that the patient might be at risk for infection with the SARS-CoV-2 virus that causes COVID-19. Institutional protocols and algorithms that pertain to the evaluation of patients at risk for COVID-19 are in a state of rapid change based on information released by regulatory bodies including the CDC and federal and state organizations. These policies and algorithms were followed during the patient's care in the ED.    This is  most consistent with preeclampsia.  Will get labs to further evaluate for other complications such as HELLP.  Denies any symptoms suggest chorioamnionitis.  Will get proBNP to evaluate for heart failure but again lower suspicion given no shortness of breath.  Consider cavernous thrombosis although patient denies any vision changes. Will give tylenol for headache and re-eval.  Patient given 5 mg of IV labetalol.  Patient's labs are normal.  No protein in the urine.  BNP is normal.  UA with some RBCs which is likely secondary to her recent delivery.  [facility]  He is okay with patient being discharged at this time on oral labetalol 200 mg twice daily given her labs are reassuring and she can follow-up with OB on Friday for blood pressure recheck.  Reevaluated patient headache is gone. Patient feels comfortable with being discharged home with this  plan.   __________________________________   FINAL CLINICAL IMPRESSION(S) / ED DIAGNOSES   Final diagnoses:  Pre-eclampsia, antepartum      MEDICATIONS GIVEN DURING THIS VISIT:  Medications  labetalol (NORMODYNE) injection 5 mg (has no administration in time range)  labetalol (NORMODYNE) injection 5 mg (5 mg Intravenous Given 02/04/19 1713)  acetaminophen (TYLENOL) tablet 1,000 mg (1,000 mg Oral Given 02/04/19 1727)     ED Discharge Orders         Ordered    labetalol (NORMODYNE) 100 MG tablet  2 times daily     02/04/19 1848           Note:  This document was prepared using Dragon voice recognition software and may include unintentional dictation errors.   Concha SeFunke, Jolleen Seman E, MD 02/04/19 [id number]

## 2019-02-04 NOTE — ED Triage Notes (Signed)
States that she went to [facility] for scheduled appt due to complications in pregnancy today. Gave birth X 4 days ago. Sent to ER due to hypertension, severe lower leg swelling and headache. Pt reports headache started today and states feet swelling has increased significantly since birth of child. Pt alert and oriented X 4, stable for discharge.

## 2019-02-04 NOTE — ED Notes (Addendum)
Pt delivered 4 days ago (37/5 term baby with complication of hypotension during labor) - pt returned to [facility] PCP today for recheck BP and was noted to be hypertensive - noted to have bilat edema in feet/ankles - pt denies [facility] or chest pain - pt c/o headache 5/10 described as "brain in swelling" - denies change in vision

## 2019-02-04 NOTE — Discharge Instructions (Signed)
Started you on a new blood pressure medicine.  You should follow-up with your OB/GYN for blood pressure recheck on Friday.  Return to the ER if you develop shortness of breath, increasing swelling in your legs or any other concerns.

## 2019-02-04 NOTE — Progress Notes (Signed)
  History of Present Illness:  Leslie Galvan is a 18 y.o. G1 P1001 who had a SVD on 11/23/21 that was complicated by gestational hypertension and a postpartum hemorrhage. She presents today for a blood pressure check. She reports increased swelling in her lower extremities since she was discharged on PPD #1. She also reports some chest pain/pressure last night and a headache currently. No visual changes, nausea/vomiting or RUQ pain. Is breast feeding. PMHx: She  has a past medical history of Allergy, Medical history non-contributory, and Urinary tract infection. Also,  has a past surgical history that includes Colonoscopy; Tonsillectomy (N/A, 01/04/2016); Tonsillectomy; Wisdom tooth extraction; and Hysteroscopy w/D&C (N/A, 02/15/2017)., family history is not on file.,  reports that she has never smoked. She has never used smokeless tobacco. She reports previous alcohol use. She reports previous drug use. Drug: Marijuana.  She has a current medication list which includes the following prescription(s): acetaminophen, ferrous sulfate, ibuprofen, multivitamin-prenatal,  and norethindrone, and the following Facility-Administered Medications: Also, has No Known Allergies.  ROS-see HPI  Physical Exam: Initial BP 140/90, then BP 140/80   Pulse 60   Ht 5\' 8"  (1.727 m)   Wt 204 lb 9.6 oz (92.8 kg)   LMP  (LMP Unknown)   Breastfeeding Yes   BMI 31.11 kg/m  Body mass index is 31.11 kg/m. Constitutional: Well nourished, well developed female in no acute distress.  Heart: RRR without murmur Lungs: CTAB Neuro: Grossly intact Psych:  Normal mood and affect.   Extremities: +2 edema in lower extremities  Assessment: Elevated blood pressure and headache and edema on PPD #4 with hx of gestational hypertension in labor R/O preeclampsia  Plan: Sent to the ER for further evaluation Called ER to give them a report Dr Kenton Kingfisher consulted  Dalia Heading, [facility], [facility]  02/04/2019

## 2019-02-06 ENCOUNTER — Encounter: Payer: Self-pay | Admitting: Obstetrics and Gynecology

## 2019-02-06 ENCOUNTER — Ambulatory Visit (INDEPENDENT_AMBULATORY_CARE_PROVIDER_SITE_OTHER): Payer: Medicaid Other | Admitting: Obstetrics and Gynecology

## 2019-02-06 ENCOUNTER — Other Ambulatory Visit: Payer: Self-pay

## 2019-02-06 VITALS — BP 130/84 | Ht 68.0 in | Wt 200.0 lb

## 2019-02-06 DIAGNOSIS — O139 Gestational [pregnancy-induced] hypertension without significant proteinuria, unspecified trimester: Secondary | ICD-10-CM
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
## 2019-02-25 ENCOUNTER — Ambulatory Visit: Payer: Medicaid Other | Admitting: Certified Nurse Midwife

## 2019-02-27 ENCOUNTER — Encounter: Payer: Self-pay | Admitting: Certified Nurse Midwife

## 2019-02-27 ENCOUNTER — Ambulatory Visit (INDEPENDENT_AMBULATORY_CARE_PROVIDER_SITE_OTHER): Payer: Medicaid Other | Admitting: Certified Nurse Midwife

## 2019-02-27 ENCOUNTER — Other Ambulatory Visit: Payer: Self-pay

## 2019-02-27 VITALS — BP 112/76 | HR 67 | Ht 68.0 in | Wt 199.0 lb

## 2019-02-27 DIAGNOSIS — O165 Unspecified maternal hypertension, complicating the puerperium: Secondary | ICD-10-CM

## 2019-02-27 NOTE — Progress Notes (Signed)
  History of Present Illness:  Leslie Galvan is a 18 y.o. who was started on labetalol approximately 3 weeks ago for gestational hypertension. She was prescribed 200 mgm BID, but has been taking only 100 mgm BID. She has been feeling lightheaded in the past couple days. Took her blood pressure yesterday and "it was really low."  She had a headache yesterday that was relieved with eating. No swelling in lower extremities. She is now 4 weeks postpartum. HAs stopped breast feeding 1-2 weeks ago due to low milk supply and baby not wanting to latch after bottle feeding.  PMHx: She  has a past medical history of Allergy, Medical history non-contributory, and Urinary tract infection. Also,  has a past surgical history that includes Colonoscopy; Tonsillectomy (N/A, 01/04/2016); Tonsillectomy; Wisdom tooth extraction; and Hysteroscopy w/D&C (N/A, 02/15/2017)., family history is not on file.,  reports that she has never smoked. She has never used smokeless tobacco. She reports previous alcohol use. She reports previous drug use. Drug: Marijuana.  She has a current medication list which includes the following prescription(s): acetaminophen, ferrous sulfate, labetalol, multivitamin-prenatal, ibuprofen, and norethindrone. Also, has No Known Allergies.  ROS  Physical Exam:  BP 112/76   Pulse 67   Ht 5\' 8"  (1.727 m)   Wt 199 lb (90.3 kg)   LMP  (LMP Unknown)   Breastfeeding No   BMI 30.26 kg/m  Body mass index is 30.26 kg/m. Repeat blood pressure 110/60 Constitutional: Well nourished, well developed female in no acute distress.  Heart: RRR without murmur Lungs: CTA all fields, normal respiratory effort Neuro: Grossly intact Psych:  Normal mood and affect.   Extremities: no edema noted  Assessment: Gestational hypertension-normotensive on low dose labetalol  Plan: Discontinue labetalol RTO in 1-2 weeks for 6 week check up and follow up blood pressure. Start minipill in 2 days. Consider switching to OCP  if blood pressure is normal off the labetalol.  Dalia Heading, [facility], [facility] 02/27/2019  4:11 PM

## 2019-03-16 ENCOUNTER — Other Ambulatory Visit: Payer: Self-pay

## 2019-03-16 ENCOUNTER — Ambulatory Visit (INDEPENDENT_AMBULATORY_CARE_PROVIDER_SITE_OTHER): Payer: Medicaid Other | Admitting: Certified Nurse Midwife

## 2019-03-16 ENCOUNTER — Encounter: Payer: Self-pay | Admitting: Certified Nurse Midwife

## 2019-03-16 VITALS — BP 120/80 | HR 63 | Ht 68.0 in | Wt 199.0 lb

## 2019-03-16 DIAGNOSIS — Z789 Other specified health status: Secondary | ICD-10-CM | POA: Insufficient documentation

## 2019-03-16 DIAGNOSIS — O9081 Anemia of the puerperium: Secondary | ICD-10-CM | POA: Diagnosis not present

## 2019-03-16 DIAGNOSIS — Z1389 Encounter for screening for other disorder: Secondary | ICD-10-CM | POA: Diagnosis not present

## 2019-03-16 DIAGNOSIS — Z23 Encounter for immunization: Secondary | ICD-10-CM | POA: Diagnosis not present

## 2019-03-16 LAB — POCT HEMOGLOBIN: Hemoglobin: 12.2 g/dL (ref 11–14.6)

## 2019-03-16 MED ORDER — LEVONORGESTREL-ETHINYL ESTRAD 0.15-30 MG-MCG PO TABS: 1.0000 | ORAL_TABLET | Freq: Every day | ORAL | 11 refills | Status: DC
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
## 2019-06-16 ENCOUNTER — Ambulatory Visit: Payer: Medicaid Other | Admitting: Certified Nurse Midwife

## 2019-06-30 ENCOUNTER — Ambulatory Visit (INDEPENDENT_AMBULATORY_CARE_PROVIDER_SITE_OTHER): Payer: Medicaid Other | Admitting: Certified Nurse Midwife

## 2019-06-30 ENCOUNTER — Other Ambulatory Visit: Payer: Self-pay

## 2019-06-30 ENCOUNTER — Encounter: Payer: Self-pay | Admitting: Certified Nurse Midwife

## 2019-06-30 VITALS — BP 120/60 | HR 60 | Ht 67.0 in | Wt 183.0 lb

## 2019-06-30 DIAGNOSIS — Z3041 Encounter for surveillance of contraceptive pills: Secondary | ICD-10-CM | POA: Diagnosis not present

## 2019-06-30 NOTE — Progress Notes (Signed)
  History of Present Illness:  Leslie Galvan is a 19 y.o. who was started on OCPs (nordette or Portia) approximately 3 months ago. Since that time, she states that she has not had problems with breakthrough bleeding, nausea, or headaches. Her menses have been regular and last 5-7 days. Has mild cramping not requiring analgesics.  PMHx: She  has a past medical history of Allergy, Gestational hypertension, Premature rupture of membranes (01/30/2019), and Urinary tract infection. Also,  has a past surgical history that includes Colonoscopy; Tonsillectomy (N/A, 01/04/2016); Wisdom tooth extraction; and Hysteroscopy with D & C (N/A, 02/15/2017)., family history is not on file.,  reports that she has never smoked. She has never used smokeless tobacco. She reports previous alcohol use. She reports previous drug use. Drug: Marijuana.  She has a current medication list which includes the following prescription(s): acetaminophen and levonorgestrel-ethinyl estradiol. Also, has No Known Allergies.  ROS  Physical Exam:  BP 120/60   Pulse 60   Ht 5\' 7"  (1.702 m)   Wt 183 lb (83 kg)   LMP 06/14/2019 (Exact Date)   BMI 28.66 kg/m  Body mass index is 28.66 kg/m. Constitutional: Well nourished, well developed female in no acute distress.   Neuro: Grossly intact Psych:  Normal mood and affect.    Assessment: Doing well on OCPs  Plan: She will undergo no change in her medical therapy. Discussed option of taking the OCPs continuously for 2-3 packs at a time to reduce the number of menses, if she desires. She was amenable to this plan and we will see her back for annual/PRN.  06/16/2019, CNM [facility], [facility] 06/30/2019  4:50 PM

## 2019-07-19 ENCOUNTER — Ambulatory Visit (HOSPITAL_COMMUNITY)
Admission: EM | Admit: 2019-07-19 | Discharge: 2019-07-19 | Disposition: A | Discharge status: Home / Self Care | Payer: Medicaid Other | Attending: Family Medicine | Admitting: Family Medicine

## 2019-07-19 ENCOUNTER — Other Ambulatory Visit: Payer: Self-pay

## 2019-07-19 ENCOUNTER — Encounter (HOSPITAL_COMMUNITY): Payer: Self-pay

## 2019-07-19 DIAGNOSIS — N920 Excessive and frequent menstruation with regular cycle: Secondary | ICD-10-CM | POA: Insufficient documentation

## 2019-07-19 LAB — CBC
HCT: 39.6 % (ref 36.0–46.0)
Hemoglobin: 12.2 g/dL (ref 12.0–15.0)
MCH: 25 pg — ABNORMAL LOW (ref 26.0–34.0)
MCHC: 30.8 g/dL (ref 30.0–36.0)
MCV: 81.1 fL (ref 80.0–100.0)
Platelets: 279 10*3/uL (ref 150–400)
RBC: 4.88 MIL/uL (ref 3.87–5.11)
RDW: 16.8 % — ABNORMAL HIGH (ref 11.5–15.5)
WBC: 6.7 10*3/uL (ref 4.0–10.5)
nRBC: 0 % (ref 0.0–0.2)

## 2019-07-19 MED ORDER — NAPROXEN 500 MG PO TABS: 500.0000 mg | ORAL_TABLET | Freq: Two times a day (BID) | ORAL | 0 refills | Status: DC

## 2019-07-19 MED ORDER — ONDANSETRON 8 MG PO TBDP: 8.0000 mg | ORAL_TABLET | Freq: Three times a day (TID) | ORAL | 0 refills | Status: DC | PRN

## 2019-07-19 MED ORDER — LEVONORGESTREL-ETHINYL ESTRAD 0.15-30 MG-MCG PO TABS: ORAL_TABLET | ORAL | 0 refills | Status: DC

## 2019-07-19 NOTE — Discharge Instructions (Signed)
Take the extra oral contraceptives for the next 4 days to stop the vaginal bleeding After this go back on your usual One-A-Day dose I have prescribed Zofran to take if you develop nausea.  This sometimes happens on increased birth control pills/estrogen I have prescribed Naprosyn to take for cramping.  Take one 2 times a day with food Call your OB/GYN next week for advice

## 2019-07-19 NOTE — ED Triage Notes (Signed)
Pt state she has had excessive bleeding for 2 weeks now. Pt states that's she busies easily.

## 2019-07-19 NOTE — ED Provider Notes (Signed)
[facility]    CSN: [id number] Arrival date & time: 07/19/19  1506      History   Chief Complaint Chief Complaint  Patient presents with  . excessive bleeding    HPI Leslie Galvan is a 19 y.o. female.   HPI  Patient states that she missed a pill, or 2, and now has irregular vaginal bleeding.  She been bleeding for the last 17 days straight.  She is starting to feel very tired.  She states she called her OB/GYN doctor and was told to come in for lab work.  She is having moderate cramping.  She states the bleeding is "like a normal period".  She states last time she had irregular bleeding she was given increased estrogen to stop the bleeding.  She is advised to call her OB/GYN doctor next week for additional advice.  She does not have any signs of infection or vaginal discharge.  No fever or chills.  She is not currently sexually active.  She has a 51-month-old baby at home who is doing well.  Past Medical History:  Diagnosis Date  . Allergy   . Gestational hypertension    postpartum after G1  . Premature rupture of membranes 01/30/2019  . Urinary tract infection     Patient Active Problem List   Diagnosis Date Noted  . Gestational hypertension 02/04/2019  . Postpartum hypertension 02/04/2019  . Postpartum care following vaginal delivery 01/31/2019  . Postpartum hemorrhage 01/31/2019    Past Surgical History:  Procedure Laterality Date  . COLONOSCOPY    . HYSTEROSCOPY WITH D & C N/A 02/15/2017   Procedure: DILATATION AND CURETTAGE /HYSTEROSCOPY;  Surgeon: Christeen Douglas, MD;  Location: [facility];  Service: Gynecology;  Laterality: N/A;  . TONSILLECTOMY N/A 01/04/2016   Procedure: TONSILLECTOMY;  Surgeon: Bud Face, MD;  Location: [facility];  Service: ENT;  Laterality: N/A;  . WISDOM TOOTH EXTRACTION      OB History    Gravida  1   Para  1   Term  1   Preterm      AB      Living  1     SAB      TAB      Ectopic      Multiple    0   Live Births  1            Home Medications    Prior to Admission medications   Medication Sig Start Date End Date Taking? Authorizing Provider  acetaminophen (TYLENOL) 325 MG tablet Take 2 tablets (650 mg total) by mouth every 4 (four) hours as needed for mild pain, moderate pain or headache (for pain scale < 4). 02/01/19   Farrel Conners, CNM  levonorgestrel-ethinyl estradiol (NORDETTE) 0.15-30 MG-MCG tablet Take 1 tablet by mouth daily. 03/16/19   Farrel Conners, CNM  levonorgestrel-ethinyl estradiol (NORDETTE) 0.15-30 MG-MCG tablet One tab TID x 2 d then one tab BID x 2 d then one daily 07/19/19   Eustace Moore, MD  naproxen (NAPROSYN) 500 MG tablet Take 1 tablet (500 mg total) by mouth 2 (two) times daily. 07/19/19   Eustace Moore, MD  ondansetron (ZOFRAN ODT) 8 MG disintegrating tablet Take 1 tablet (8 mg total) by mouth every 8 (eight) hours as needed for nausea or vomiting. 07/19/19   Eustace Moore, MD    Family History History reviewed. No pertinent family history.  Social History Social History   Tobacco Use  .  Smoking status: Never Smoker  . Smokeless tobacco: Never Used  Substance Use Topics  . Alcohol use: Not Currently    Comment: UNSURE BUT GRANDMOTHER STATES PTS TWIN SISTER SAID SHE HAS DRANK ALCOHOL  . Drug use: Not Currently    Types: Marijuana    Comment: PER GRANDMOTHER CHERYL PER PTS TWIN SISTER     Allergies   Patient has no known allergies.   Review of Systems Review of Systems  Constitutional: Positive for fatigue.  Genitourinary: Positive for menstrual problem.     Physical Exam Triage Vital Signs ED Triage Vitals  Enc Vitals Group     BP 07/19/19 1605 125/70     Pulse Rate 07/19/19 1605 69     Resp 07/19/19 1605 16     Temp 07/19/19 1605 98.4 F (36.9 C)     Temp Source 07/19/19 1605 Oral     SpO2 07/19/19 1605 100 %     Weight 07/19/19 1606 186 lb (84.4 kg)     Height --      Head Circumference --       Peak Flow --      Pain Score 07/19/19 1606 6     Pain Loc --      Pain Edu? --      Excl. in GC? --    No data found.  Updated Vital Signs BP 125/70 (BP Location: Right Arm)   Pulse 69   Temp 98.4 F (36.9 C) (Oral)   Resp 16   Wt 84.4 kg   LMP 07/19/2019   SpO2 100%   BMI 29.13 kg/m      Physical Exam Constitutional:      General: She is not in acute distress.    Appearance: She is well-developed and normal weight.  HENT:     Head: Normocephalic and atraumatic.     Mouth/Throat:     Comments: Mask in place Eyes:     Conjunctiva/sclera: Conjunctivae normal.     Pupils: Pupils are equal, round, and reactive to light.  Cardiovascular:     Rate and Rhythm: Normal rate.  Pulmonary:     Effort: Pulmonary effort is normal. No respiratory distress.  Abdominal:     General: Abdomen is flat. There is no distension.     Palpations: Abdomen is soft.     Tenderness: There is no abdominal tenderness.  Musculoskeletal:        General: Normal range of motion.     Cervical back: Normal range of motion.  Skin:    General: Skin is warm and dry.  Neurological:     Mental Status: She is alert.  Psychiatric:        Mood and Affect: Mood normal.        Behavior: Behavior normal.      UC Treatments / Results  Labs (all labs ordered are listed, but only abnormal results are displayed) Labs Reviewed  CBC    EKG   Radiology No results found.  Procedures Procedures (including critical care time)  Medications Ordered in UC Medications - No data to display  Initial Impression / Assessment and Plan / UC Course  I have reviewed the triage vital signs and the nursing notes.  Pertinent labs & imaging results that were available during my care of the patient were reviewed by me and considered in my medical decision making (see chart for details).     Reviewed dysfunctional bleeding.  Need for follow-up with her OB/GYN doctor.  Need to continue daily oral contraceptives.   Recommend setting an alarm on her phone so she does not forget pills. Final Clinical Impressions(s) / UC Diagnoses   Final diagnoses:  Menorrhagia with regular cycle     Discharge Instructions     Take the extra oral contraceptives for the next 4 days to stop the vaginal bleeding After this go back on your usual One-A-Day dose I have prescribed Zofran to take if you develop nausea.  This sometimes happens on increased birth control pills/estrogen I have prescribed Naprosyn to take for cramping.  Take one 2 times a day with food Call your OB/GYN next week for advice   ED Prescriptions    Medication Sig Dispense Auth. Provider   levonorgestrel-ethinyl estradiol (NORDETTE) 0.15-30 MG-MCG tablet One tab TID x 2 d then one tab BID x 2 d then one daily 1 Package Raylene Everts, MD   naproxen (NAPROSYN) 500 MG tablet Take 1 tablet (500 mg total) by mouth 2 (two) times daily. 30 tablet Raylene Everts, MD   ondansetron (ZOFRAN ODT) 8 MG disintegrating tablet Take 1 tablet (8 mg total) by mouth every 8 (eight) hours as needed for nausea or vomiting. 20 tablet Raylene Everts, MD     PDMP not reviewed this encounter.   Raylene Everts, MD 07/19/19 [phone]

## 2019-07-20 ENCOUNTER — Telehealth: Payer: Self-pay

## 2019-07-20 NOTE — Telephone Encounter (Signed)
Pt called triage this weekend she reports bleeding for 17 days gave birth in September and been on birth control. Bleeding is heavy. Left message to return call if she needed an appointment she went to er yesterday,

## 2019-11-02 ENCOUNTER — Ambulatory Visit (INDEPENDENT_AMBULATORY_CARE_PROVIDER_SITE_OTHER): Payer: Medicaid Other | Admitting: Certified Nurse Midwife

## 2019-11-02 ENCOUNTER — Other Ambulatory Visit: Payer: Self-pay

## 2019-11-02 ENCOUNTER — Encounter: Payer: Self-pay | Admitting: Certified Nurse Midwife

## 2019-11-02 VITALS — BP 120/80 | HR 80 | Ht 68.0 in | Wt 168.0 lb

## 2019-11-02 DIAGNOSIS — N898 Other specified noninflammatory disorders of vagina: Secondary | ICD-10-CM | POA: Diagnosis not present

## 2019-11-02 DIAGNOSIS — Z113 Encounter for screening for infections with a predominantly sexual mode of transmission: Secondary | ICD-10-CM

## 2019-11-02 DIAGNOSIS — N760 Acute vaginitis: Secondary | ICD-10-CM

## 2019-11-02 DIAGNOSIS — B9689 Other specified bacterial agents as the cause of diseases classified elsewhere: Secondary | ICD-10-CM | POA: Diagnosis not present

## 2019-11-02 MED ORDER — METRONIDAZOLE 500 MG PO TABS: 500.0000 mg | ORAL_TABLET | Freq: Two times a day (BID) | ORAL | 0 refills | Status: AC

## 2019-11-05 ENCOUNTER — Telehealth: Payer: Self-pay | Admitting: Certified Nurse Midwife

## 2019-11-05 LAB — CHLAMYDIA/GONOCOCCUS/TRICHOMONAS, NAA
Chlamydia by NAA: POSITIVE — AB
Gonococcus by NAA: NEGATIVE
Trich vag by NAA: NEGATIVE

## 2019-11-05 NOTE — Telephone Encounter (Signed)
Can you call pt please.  

## 2019-11-05 NOTE — Telephone Encounter (Signed)
Pt received results and would like a call back. CLG not in office until Friday and wants call sooner. Please advise

## 2019-11-05 NOTE — Telephone Encounter (Signed)
I attempted to call patient and was unable to reach her or leave a message with the mobile number in her chart. I'm thinking Jill Side will call her tomorrow and prescribe med, etc.

## 2019-11-05 NOTE — Telephone Encounter (Signed)
Just realized tomorrow IS Friday... whoops

## 2019-11-06 ENCOUNTER — Encounter: Payer: Self-pay | Admitting: Certified Nurse Midwife

## 2019-11-06 ENCOUNTER — Telehealth: Payer: Self-pay | Admitting: Certified Nurse Midwife

## 2019-11-06 DIAGNOSIS — A749 Chlamydial infection, unspecified: Secondary | ICD-10-CM | POA: Insufficient documentation

## 2019-11-06 MED ORDER — AZITHROMYCIN 500 MG PO TABS: 1000.0000 mg | ORAL_TABLET | Freq: Once | ORAL | 0 refills | Status: AC

## 2019-11-06 NOTE — Telephone Encounter (Signed)
[place] returned my call and advised that would send in RX for Azithromycin 1 GM to pharmacy. Her partner also needs to be treated. DIscussed not having intercourse until after 1 week when both have been treated. Farrel Conners, CNM

## 2019-11-09 LAB — POCT WET PREP (WET MOUNT): Trichomonas Wet Prep HPF POC: ABSENT
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
## 2019-12-08 ENCOUNTER — Telehealth: Payer: Self-pay

## 2019-12-08 NOTE — Telephone Encounter (Signed)
Pt called and reported a large golfball size lump in her private area pt states she works tomorrow from 7:30am to 4pm. I called pt to advise her to go to the Urgent care, she did not answer, I could not leave a message

## 2020-03-21 ENCOUNTER — Encounter: Payer: Self-pay | Admitting: Obstetrics and Gynecology

## 2020-03-21 ENCOUNTER — Ambulatory Visit (INDEPENDENT_AMBULATORY_CARE_PROVIDER_SITE_OTHER): Payer: Medicaid Other | Admitting: Obstetrics and Gynecology

## 2020-03-21 ENCOUNTER — Other Ambulatory Visit (HOSPITAL_COMMUNITY)
Admission: RE | Admit: 2020-03-21 | Discharge: 2020-03-21 | Disposition: A | Discharge status: Home / Self Care | Payer: Medicaid Other | Source: Ambulatory Visit | Attending: Obstetrics and Gynecology | Admitting: Obstetrics and Gynecology

## 2020-03-21 ENCOUNTER — Other Ambulatory Visit: Payer: Self-pay

## 2020-03-21 VITALS — BP 120/60 | Ht 68.0 in | Wt 156.0 lb

## 2020-03-21 DIAGNOSIS — A749 Chlamydial infection, unspecified: Secondary | ICD-10-CM

## 2020-03-21 DIAGNOSIS — Z113 Encounter for screening for infections with a predominantly sexual mode of transmission: Secondary | ICD-10-CM | POA: Insufficient documentation

## 2020-03-21 DIAGNOSIS — N898 Other specified noninflammatory disorders of vagina: Secondary | ICD-10-CM | POA: Diagnosis not present

## 2020-03-21 DIAGNOSIS — A599 Trichomoniasis, unspecified: Secondary | ICD-10-CM | POA: Insufficient documentation

## 2020-03-21 LAB — POCT WET PREP WITH KOH
Clue Cells Wet Prep HPF POC: NEGATIVE
KOH Prep POC: NEGATIVE
Trichomonas, UA: NEGATIVE
Yeast Wet Prep HPF POC: NEGATIVE
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
## 2020-03-21 NOTE — Patient Instructions (Signed)
I value your feedback and entrusting us with your care. If you get a [facility] patient survey, I would appreciate you taking the time to let us know about your experience today. Thank you!  As of April 30, 2019, your lab results will be released to your MyChart immediately, before I even have a chance to see them. Please give me time to review them and contact you if there are any abnormalities. Thank you for your patience.  

## 2020-03-24 LAB — CERVICOVAGINAL ANCILLARY ONLY
Chlamydia: POSITIVE — AB
Comment: NEGATIVE
Comment: NEGATIVE
Comment: NORMAL
Neisseria Gonorrhea: NEGATIVE
Trichomonas: NEGATIVE

## 2020-03-24 MED ORDER — AZITHROMYCIN 500 MG PO TABS: 1000.0000 mg | ORAL_TABLET | Freq: Once | ORAL | 0 refills | Status: AC

## 2020-03-24 NOTE — Progress Notes (Signed)
[facility] notified and msg sent via mychart to pt to call office.

## 2020-03-24 NOTE — Addendum Note (Signed)
Addended by: Althea Grimmer B on: 03/24/2020 02:37 PM   Modules accepted: Orders

## 2020-03-24 NOTE — Progress Notes (Signed)
Pls notify [facility]. Pls also make sure pt reads msg so she can get treated. If not, I'll try to call her again. Thx

## 2020-04-20 ENCOUNTER — Encounter: Payer: Self-pay | Admitting: Obstetrics and Gynecology

## 2020-04-20 ENCOUNTER — Ambulatory Visit: Payer: Medicaid Other | Admitting: Obstetrics and Gynecology

## 2020-04-20 ENCOUNTER — Other Ambulatory Visit: Payer: Self-pay

## 2020-04-20 ENCOUNTER — Other Ambulatory Visit (HOSPITAL_COMMUNITY)
Admission: RE | Admit: 2020-04-20 | Discharge: 2020-04-20 | Disposition: A | Discharge status: Home / Self Care | Payer: BC Managed Care – PPO | Source: Ambulatory Visit | Attending: Obstetrics and Gynecology | Admitting: Obstetrics and Gynecology

## 2020-04-20 ENCOUNTER — Ambulatory Visit (INDEPENDENT_AMBULATORY_CARE_PROVIDER_SITE_OTHER): Payer: BC Managed Care – PPO | Admitting: Obstetrics and Gynecology

## 2020-04-20 VITALS — BP 110/70 | Ht 68.0 in | Wt 162.0 lb

## 2020-04-20 DIAGNOSIS — Z113 Encounter for screening for infections with a predominantly sexual mode of transmission: Secondary | ICD-10-CM | POA: Insufficient documentation

## 2020-04-20 DIAGNOSIS — A749 Chlamydial infection, unspecified: Secondary | ICD-10-CM | POA: Diagnosis not present

## 2020-04-20 DIAGNOSIS — R102 Pelvic and perineal pain: Secondary | ICD-10-CM | POA: Diagnosis not present

## 2020-04-20 MED ORDER — DOXYCYCLINE HYCLATE 100 MG PO CAPS: 100.0000 mg | ORAL_CAPSULE | Freq: Two times a day (BID) | ORAL | 0 refills | Status: AC

## 2020-04-20 MED ORDER — CEFTRIAXONE SODIUM 250 MG IJ SOLR
250.0000 mg | Freq: Once | INTRAMUSCULAR | Status: AC
  Administered 2020-04-20: 250 mg via INTRAMUSCULAR

## 2020-04-20 NOTE — Patient Instructions (Signed)
I value your feedback and entrusting us with your care. If you get a [facility] patient survey, I would appreciate you taking the time to let us know about your experience today. Thank you!  As of April 30, 2019, your lab results will be released to your MyChart immediately, before I even have a chance to see them. Please give me time to review them and contact you if there are any abnormalities. Thank you for your patience.  
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
## 2020-04-22 LAB — CERVICOVAGINAL ANCILLARY ONLY
Chlamydia: NEGATIVE
Comment: NEGATIVE
Comment: NORMAL
Neisseria Gonorrhea: NEGATIVE

## 2020-04-23 ENCOUNTER — Encounter: Payer: Self-pay | Admitting: Obstetrics and Gynecology

## 2020-04-23 DIAGNOSIS — R102 Pelvic and perineal pain: Secondary | ICD-10-CM

## 2020-04-27 ENCOUNTER — Ambulatory Visit (INDEPENDENT_AMBULATORY_CARE_PROVIDER_SITE_OTHER): Payer: BC Managed Care – PPO

## 2020-04-27 DIAGNOSIS — R102 Pelvic and perineal pain: Secondary | ICD-10-CM

## 2020-04-28 ENCOUNTER — Telehealth: Payer: Self-pay | Admitting: Obstetrics and Gynecology

## 2020-04-28 ENCOUNTER — Encounter: Payer: Self-pay | Admitting: Obstetrics and Gynecology

## 2020-04-28 NOTE — Telephone Encounter (Signed)
LM with neg GYN u/s results except small LTO cyst. Nothing further to do. Could be getting smaller, hence etiology of pain; or could be PID for which pt is doing tx. F/u if sx persist. Reassurance.

## 2020-06-14 ENCOUNTER — Ambulatory Visit (INDEPENDENT_AMBULATORY_CARE_PROVIDER_SITE_OTHER): Payer: BC Managed Care – PPO | Admitting: Obstetrics and Gynecology

## 2020-06-14 ENCOUNTER — Other Ambulatory Visit (HOSPITAL_COMMUNITY)
Admission: RE | Admit: 2020-06-14 | Discharge: 2020-06-14 | Disposition: A | Discharge status: Home / Self Care | Payer: BC Managed Care – PPO | Source: Ambulatory Visit | Attending: Obstetrics and Gynecology | Admitting: Obstetrics and Gynecology

## 2020-06-14 ENCOUNTER — Other Ambulatory Visit: Payer: Self-pay

## 2020-06-14 ENCOUNTER — Encounter: Payer: Self-pay | Admitting: Obstetrics and Gynecology

## 2020-06-14 VITALS — BP 100/60 | Ht 68.0 in | Wt 157.0 lb

## 2020-06-14 DIAGNOSIS — B9689 Other specified bacterial agents as the cause of diseases classified elsewhere: Secondary | ICD-10-CM | POA: Diagnosis not present

## 2020-06-14 DIAGNOSIS — N898 Other specified noninflammatory disorders of vagina: Secondary | ICD-10-CM

## 2020-06-14 DIAGNOSIS — Z113 Encounter for screening for infections with a predominantly sexual mode of transmission: Secondary | ICD-10-CM

## 2020-06-14 DIAGNOSIS — N760 Acute vaginitis: Secondary | ICD-10-CM

## 2020-06-14 LAB — POCT WET PREP WITH KOH
Clue Cells Wet Prep HPF POC: POSITIVE
KOH Prep POC: POSITIVE — AB
Trichomonas, UA: NEGATIVE
Yeast Wet Prep HPF POC: NEGATIVE

## 2020-06-14 MED ORDER — METRONIDAZOLE 500 MG PO TABS: 500.0000 mg | ORAL_TABLET | Freq: Two times a day (BID) | ORAL | 0 refills | Status: AC

## 2020-06-14 NOTE — Patient Instructions (Signed)
I value your feedback and you entrusting us with your care. If you get a [facility] patient survey, I would appreciate you taking the time to let us know about your experience today. Thank you! ? ? ?
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
## 2020-06-15 LAB — CERVICOVAGINAL ANCILLARY ONLY
Chlamydia: NEGATIVE
Comment: NEGATIVE
Comment: NEGATIVE
Comment: NORMAL
Neisseria Gonorrhea: NEGATIVE
Trichomonas: NEGATIVE

## 2020-07-15 ENCOUNTER — Encounter: Payer: Self-pay | Admitting: Obstetrics and Gynecology

## 2020-07-15 DIAGNOSIS — A749 Chlamydial infection, unspecified: Secondary | ICD-10-CM

## 2020-07-15 DIAGNOSIS — A6004 Herpesviral vulvovaginitis: Secondary | ICD-10-CM

## 2020-07-19 DIAGNOSIS — A6000 Herpesviral infection of urogenital system, unspecified: Secondary | ICD-10-CM

## 2020-07-19 HISTORY — DX: Herpesviral infection of urogenital system, unspecified: A60.00

## 2020-07-27 ENCOUNTER — Other Ambulatory Visit: Payer: Self-pay

## 2020-07-27 ENCOUNTER — Encounter: Payer: Self-pay | Admitting: Obstetrics and Gynecology

## 2020-07-27 ENCOUNTER — Other Ambulatory Visit (HOSPITAL_COMMUNITY)
Admission: RE | Admit: 2020-07-27 | Discharge: 2020-07-27 | Disposition: A | Discharge status: Home / Self Care | Payer: BC Managed Care – PPO | Source: Ambulatory Visit | Attending: Obstetrics and Gynecology | Admitting: Obstetrics and Gynecology

## 2020-07-27 ENCOUNTER — Ambulatory Visit (INDEPENDENT_AMBULATORY_CARE_PROVIDER_SITE_OTHER): Payer: BC Managed Care – PPO | Admitting: Obstetrics and Gynecology

## 2020-07-27 VITALS — BP 110/80 | Ht 68.0 in | Wt 159.0 lb

## 2020-07-27 DIAGNOSIS — B9689 Other specified bacterial agents as the cause of diseases classified elsewhere: Secondary | ICD-10-CM | POA: Diagnosis not present

## 2020-07-27 DIAGNOSIS — N760 Acute vaginitis: Secondary | ICD-10-CM

## 2020-07-27 DIAGNOSIS — Z113 Encounter for screening for infections with a predominantly sexual mode of transmission: Secondary | ICD-10-CM | POA: Diagnosis not present

## 2020-07-27 DIAGNOSIS — N3001 Acute cystitis with hematuria: Secondary | ICD-10-CM | POA: Diagnosis not present

## 2020-07-27 DIAGNOSIS — N898 Other specified noninflammatory disorders of vagina: Secondary | ICD-10-CM | POA: Diagnosis not present

## 2020-07-27 DIAGNOSIS — A6004 Herpesviral vulvovaginitis: Secondary | ICD-10-CM | POA: Diagnosis not present

## 2020-07-27 LAB — POCT URINALYSIS DIPSTICK
Bilirubin, UA: NEGATIVE
Glucose, UA: NEGATIVE
Ketones, UA: NEGATIVE
Nitrite, UA: POSITIVE
Protein, UA: POSITIVE — AB
Spec Grav, UA: 1.02 (ref 1.010–1.025)
pH, UA: 6 (ref 5.0–8.0)

## 2020-07-27 LAB — POCT WET PREP WITH KOH
Clue Cells Wet Prep HPF POC: POSITIVE
KOH Prep POC: POSITIVE — AB
Trichomonas, UA: NEGATIVE
Yeast Wet Prep HPF POC: NEGATIVE

## 2020-07-27 MED ORDER — NITROFURANTOIN MONOHYD MACRO 100 MG PO CAPS
100.0000 mg | ORAL_CAPSULE | Freq: Two times a day (BID) | ORAL | 0 refills | Status: AC
Start: 2020-07-27 — End: 2020-08-03

## 2020-07-27 MED ORDER — METRONIDAZOLE 500 MG PO TABS: 500.0000 mg | ORAL_TABLET | Freq: Two times a day (BID) | ORAL | 0 refills | Status: AC

## 2020-07-27 NOTE — Patient Instructions (Signed)
I value your feedback and you entrusting us with your care. If you get a [facility] patient survey, I would appreciate you taking the time to let us know about your experience today. Thank you! ? ? ?
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
## 2020-07-28 ENCOUNTER — Telehealth: Payer: Self-pay

## 2020-07-28 ENCOUNTER — Encounter: Payer: Self-pay | Admitting: Obstetrics and Gynecology

## 2020-07-28 LAB — HSV 2 ANTIBODY, IGG: HSV 2 IgG, Type Spec: 20.8 index — ABNORMAL HIGH (ref 0.00–0.90)

## 2020-07-28 NOTE — Telephone Encounter (Signed)
Pt calling [facility] back.  [phone]

## 2020-07-29 LAB — CERVICOVAGINAL ANCILLARY ONLY
Chlamydia: POSITIVE — AB
Comment: NEGATIVE
Comment: NEGATIVE
Comment: NORMAL
Neisseria Gonorrhea: NEGATIVE
Trichomonas: NEGATIVE

## 2020-07-29 LAB — URINE CULTURE

## 2020-07-29 MED ORDER — AZITHROMYCIN 500 MG PO TABS: 1000.0000 mg | ORAL_TABLET | Freq: Once | ORAL | 0 refills | Status: AC

## 2020-07-29 MED ORDER — VALACYCLOVIR HCL 500 MG PO TABS: 500.0000 mg | ORAL_TABLET | Freq: Every day | ORAL | 1 refills | Status: DC

## 2020-07-29 NOTE — Telephone Encounter (Signed)
Rx azithro for chlamydia. Partner to be treated, no sex activity for 1 wk after both tx. RTO in 4 wks for TOC. Discussed herpes dx and daily vs episodic valtrex tx. Pt elects for daily tx. F/u prn.

## 2020-07-29 NOTE — Progress Notes (Signed)
Pls notify [facility]

## 2020-08-02 NOTE — Progress Notes (Signed)
[facility] notified. 

## 2020-08-20 ENCOUNTER — Encounter: Payer: Self-pay | Admitting: Obstetrics and Gynecology

## 2020-08-20 DIAGNOSIS — Z0000 Encounter for general adult medical examination without abnormal findings: Secondary | ICD-10-CM

## 2020-08-22 NOTE — Telephone Encounter (Signed)
F/u

## 2020-08-25 ENCOUNTER — Encounter: Payer: Self-pay | Admitting: Obstetrics and Gynecology

## 2020-08-25 ENCOUNTER — Other Ambulatory Visit: Payer: Self-pay

## 2020-08-25 ENCOUNTER — Ambulatory Visit (INDEPENDENT_AMBULATORY_CARE_PROVIDER_SITE_OTHER): Payer: BC Managed Care – PPO | Admitting: Obstetrics and Gynecology

## 2020-08-25 ENCOUNTER — Other Ambulatory Visit (HOSPITAL_COMMUNITY)
Admission: RE | Admit: 2020-08-25 | Discharge: 2020-08-25 | Disposition: A | Discharge status: Home / Self Care | Payer: BC Managed Care – PPO | Source: Ambulatory Visit | Attending: Obstetrics and Gynecology | Admitting: Obstetrics and Gynecology

## 2020-08-25 VITALS — BP 110/60 | Ht 68.0 in | Wt 158.0 lb

## 2020-08-25 DIAGNOSIS — Z0000 Encounter for general adult medical examination without abnormal findings: Secondary | ICD-10-CM

## 2020-08-25 DIAGNOSIS — Z113 Encounter for screening for infections with a predominantly sexual mode of transmission: Secondary | ICD-10-CM | POA: Insufficient documentation

## 2020-08-25 DIAGNOSIS — Z77098 Contact with and (suspected) exposure to other hazardous, chiefly nonmedicinal, chemicals: Secondary | ICD-10-CM

## 2020-08-25 DIAGNOSIS — A749 Chlamydial infection, unspecified: Secondary | ICD-10-CM | POA: Insufficient documentation

## 2020-08-25 DIAGNOSIS — R1011 Right upper quadrant pain: Secondary | ICD-10-CM | POA: Diagnosis not present

## 2020-08-25 DIAGNOSIS — A5609 Other chlamydial infection of lower genitourinary tract: Secondary | ICD-10-CM | POA: Diagnosis not present

## 2020-08-25 NOTE — Patient Instructions (Signed)
I value your feedback and you entrusting us with your care. If you get a [facility] patient survey, I would appreciate you taking the time to let us know about your experience today. Thank you! ? ? ?
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
## 2020-08-26 LAB — COMPREHENSIVE METABOLIC PANEL
ALT: 9 IU/L (ref 0–32)
AST: 16 IU/L (ref 0–40)
Albumin/Globulin Ratio: 1.4 (ref 1.2–2.2)
Albumin: 4.4 g/dL (ref 3.9–5.0)
Alkaline Phosphatase: 62 IU/L (ref 42–106)
BUN/Creatinine Ratio: 9 (ref 9–23)
BUN: 6 mg/dL (ref 6–20)
Bilirubin Total: 0.3 mg/dL (ref 0.0–1.2)
CO2: 23 mmol/L (ref 20–29)
Calcium: 9.8 mg/dL (ref 8.7–10.2)
Chloride: 102 mmol/L (ref 96–106)
Creatinine, Ser: 0.7 mg/dL (ref 0.57–1.00)
Globulin, Total: 3.2 g/dL (ref 1.5–4.5)
Glucose: 79 mg/dL (ref 65–99)
Potassium: 3.7 mmol/L (ref 3.5–5.2)
Sodium: 138 mmol/L (ref 134–144)
Total Protein: 7.6 g/dL (ref 6.0–8.5)
eGFR: 127 mL/min/{1.73_m2} (ref >59)

## 2020-08-26 NOTE — Addendum Note (Signed)
Addended by: Althea Grimmer B on: 08/26/2020 03:30 PM   Modules accepted: Orders

## 2020-08-29 LAB — CERVICOVAGINAL ANCILLARY ONLY
Chlamydia: NEGATIVE
Comment: NEGATIVE
Comment: NORMAL
Neisseria Gonorrhea: NEGATIVE

## 2020-08-30 ENCOUNTER — Encounter: Payer: Self-pay | Admitting: *Deleted

## 2020-09-13 ENCOUNTER — Ambulatory Visit: Payer: BC Managed Care – PPO | Admitting: Gastroenterology

## 2020-09-13 ENCOUNTER — Encounter: Payer: Self-pay | Admitting: Gastroenterology

## 2021-01-05 ENCOUNTER — Emergency Department
Admission: EM | Admit: 2021-01-05 | Discharge: 2021-01-05 | Disposition: A | Discharge status: Home / Self Care | Payer: BC Managed Care – PPO | Attending: Emergency Medicine | Admitting: Emergency Medicine

## 2021-01-05 ENCOUNTER — Encounter: Payer: Self-pay | Admitting: *Deleted

## 2021-01-05 ENCOUNTER — Emergency Department: Payer: BC Managed Care – PPO

## 2021-01-05 ENCOUNTER — Other Ambulatory Visit: Payer: Self-pay

## 2021-01-05 DIAGNOSIS — R070 Pain in throat: Secondary | ICD-10-CM | POA: Diagnosis not present

## 2021-01-05 DIAGNOSIS — R198 Other specified symptoms and signs involving the digestive system and abdomen: Secondary | ICD-10-CM

## 2021-01-05 DIAGNOSIS — K219 Gastro-esophageal reflux disease without esophagitis: Secondary | ICD-10-CM | POA: Insufficient documentation

## 2021-01-05 DIAGNOSIS — N760 Acute vaginitis: Secondary | ICD-10-CM | POA: Diagnosis not present

## 2021-01-05 DIAGNOSIS — R0989 Other specified symptoms and signs involving the circulatory and respiratory systems: Secondary | ICD-10-CM | POA: Diagnosis not present

## 2021-01-05 DIAGNOSIS — F458 Other somatoform disorders: Secondary | ICD-10-CM | POA: Diagnosis not present

## 2021-01-05 DIAGNOSIS — J029 Acute pharyngitis, unspecified: Secondary | ICD-10-CM | POA: Diagnosis not present

## 2021-01-05 MED ORDER — LIDOCAINE VISCOUS HCL 2 % MT SOLN
15.0000 mL | Freq: Once | OROMUCOSAL | Status: AC
  Administered 2021-01-05: 15 mL via ORAL
  Filled 2021-01-05: qty 15

## 2021-01-05 MED ORDER — LIDOCAINE VISCOUS HCL 2 % MT SOLN: 10.0000 mL | Freq: Three times a day (TID) | OROMUCOSAL | 1 refills | Status: DC | PRN

## 2021-01-05 MED ORDER — ALUM & MAG HYDROXIDE-SIMETH 200-200-20 MG/5ML PO SUSP
30.0000 mL | Freq: Once | ORAL | Status: AC
  Administered 2021-01-05: 30 mL via ORAL
  Filled 2021-01-05: qty 30

## 2021-01-05 MED ORDER — OMEPRAZOLE 10 MG PO CPDR: 10.0000 mg | DELAYED_RELEASE_CAPSULE | Freq: Every day | ORAL | 11 refills | Status: DC

## 2021-01-05 NOTE — ED Triage Notes (Signed)
Pt reports throat pain.  Pt states it hurts when swallowing.  Pt was eating noodles last night and feels like something is stuck in throat.  No resp distress.  Pt is able to eat and drink.  Pt alert  speech clear.

## 2021-01-05 NOTE — ED Provider Notes (Signed)
[name] Emergency Department Provider Note  ____________________________________________  Time seen: Approximately 9:23 PM  I have reviewed the triage vital signs and the nursing notes.   HISTORY  Chief Complaint throat pain    HPI Leslie Galvan is a 20 y.o. female who presents emergency department complaining of sore throat/globus sensation.  She states that she was eating some lo mein last night, did not have any difficulty until she awoke feeling like something was stuck in her throat.  Patient has had several days of increased reflux symptoms and she is unsure whether this may be related.  No fevers or chills, nasal congestion, cough.  No medications prior to arrival.  She still eating and drinking at this time.       Past Medical History:  Diagnosis Date   Allergy    Chlamydia    Genital herpes 07/2020   positive type 2 IgG after resolved lesions   Gestational hypertension    postpartum after G1   PID (acute pelvic inflammatory disease)    Premature rupture of membranes 01/30/2019   Urinary tract infection     Patient Active Problem List   Diagnosis Date Noted   Acute cystitis with hematuria 07/27/2020   Bacterial vaginosis 06/14/2020   Trichomoniasis 03/21/2020   Chlamydia infection 11/06/2019   Gestational hypertension 02/04/2019   Postpartum hypertension 02/04/2019    Past Surgical History:  Procedure Laterality Date   COLONOSCOPY     HYSTEROSCOPY WITH D & C N/A 02/15/2017   Procedure: DILATATION AND CURETTAGE /HYSTEROSCOPY;  Surgeon: Christeen Douglas, MD;  Location: [facility];  Service: Gynecology;  Laterality: N/A;   TONSILLECTOMY N/A 01/04/2016   Procedure: TONSILLECTOMY;  Surgeon: Bud Face, MD;  Location: [facility];  Service: ENT;  Laterality: N/A;   WISDOM TOOTH EXTRACTION      Prior to Admission medications   Medication Sig Start Date End Date Taking? Authorizing Provider  GI Cocktail (alum & mag  hydroxide-simethicone/lidocaine)oral mixture Take 10 mLs by mouth 3 (three) times daily as needed (sore throat/foreign body sensation). 01/05/21  Yes Jeffrie Lofstrom, Delorise Royals, PA-C  omeprazole (PRILOSEC) 10 MG capsule Take 1 capsule (10 mg total) by mouth daily. 01/05/21  Yes Duriel Deery, Delorise Royals, PA-C  valACYclovir (VALTREX) 500 MG tablet Take 1 tablet (500 mg total) by mouth daily. 07/29/20   Copland, Ilona Sorrel, PA-C    Allergies Patient has no known allergies.  No family history on file.  Social History Social History   Tobacco Use   Smoking status: Never   Smokeless tobacco: Never  Vaping Use   Vaping Use: Never used  Substance Use Topics   Alcohol use: Not Currently    Comment: UNSURE BUT GRANDMOTHER STATES PTS TWIN SISTER SAID SHE HAS DRANK ALCOHOL   Drug use: Not Currently    Types: Marijuana    Comment: PER GRANDMOTHER CHERYL PER PTS TWIN SISTER     Review of Systems  Constitutional: No fever/chills Eyes: No visual changes. No discharge ENT: Sore throat with globus sensation Cardiovascular: no chest pain. Respiratory: no cough. No SOB. Gastrointestinal: No abdominal pain.  No nausea, no vomiting.  No diarrhea.  No constipation. Musculoskeletal: Negative for musculoskeletal pain. Skin: Negative for rash, abrasions, lacerations, ecchymosis. Neurological: Negative for headaches, focal weakness or numbness.  10 System ROS otherwise negative.  ____________________________________________   PHYSICAL EXAM:  VITAL SIGNS: ED Triage Vitals  Enc Vitals Group     BP 01/05/21 1851 127/81     Pulse Rate  01/05/21 1851 66     Resp 01/05/21 1851 20     Temp 01/05/21 1851 98.7 F (37.1 C)     Temp Source 01/05/21 1851 Oral     SpO2 01/05/21 1851 100 %     Weight 01/05/21 1852 176 lb (79.8 kg)     Height 01/05/21 1852 5\' 8"  (1.727 m)     Head Circumference --      Peak Flow --      Pain Score 01/05/21 1852 6     Pain Loc --      Pain Edu? --      Excl. in GC? --       Constitutional: Alert and oriented. Well appearing and in no acute distress. Eyes: Conjunctivae are normal. PERRL. EOMI. Head: Atraumatic. ENT:      Ears:       Nose: No congestion/rhinnorhea.      Mouth/Throat: Mucous membranes are moist.  Oropharynx is nonerythematous and nonedematous.  Patient has a Mallampati of 1 and oropharynx is very clearly identified.  There is no evidence of foreign body in the oropharynx.  No uvular deviation.  No exudates. Neck: No stridor.  Neck is supple full range of motion.  No tenderness along the anterior neck.  No erythema or edema of the anterior neck. Hematological/Lymphatic/Immunilogical: No cervical lymphadenopathy. Cardiovascular: Normal rate, regular rhythm. Normal S1 and S2.  Good peripheral circulation. Respiratory: Normal respiratory effort without tachypnea or retractions. Lungs CTAB. Good air entry to the bases with no decreased or absent breath sounds. Musculoskeletal: Full range of motion to all extremities. No gross deformities appreciated. Neurologic:  Normal speech and language. No gross focal neurologic deficits are appreciated.  Skin:  Skin is warm, dry and intact. No rash noted. Psychiatric: Mood and affect are normal. Speech and behavior are normal. Patient exhibits appropriate insight and judgement.   ____________________________________________   LABS (all labs ordered are listed, but only abnormal results are displayed)  Labs Reviewed - No data to display ____________________________________________  EKG   ____________________________________________  RADIOLOGY I personally viewed and evaluated these images as part of my medical decision making, as well as reviewing the written report by the radiologist.  ED Provider Interpretation: No evidence of acute process on neck or chest x-ray specifically no radiopaque foreign body  DG Neck Soft Tissue  Result Date: 01/05/2021 CLINICAL DATA:  Food bolus sensation EXAM:  NECK SOFT TISSUES - 1+ VIEW COMPARISON:  None. FINDINGS: There is no evidence of retropharyngeal soft tissue swelling or epiglottic enlargement. The cervical airway is unremarkable and no radio-opaque foreign body identified. IMPRESSION: Negative. Electronically Signed   By: 01/07/2021 M.D.   On: 01/05/2021 21:53   DG Chest 1 View  Result Date: 01/05/2021 CLINICAL DATA:  Throat pain EXAM: CHEST  1 VIEW COMPARISON:  12/30/2020 FINDINGS: The heart size and mediastinal contours are within normal limits. Both lungs are clear. The visualized skeletal structures are unremarkable. IMPRESSION: No active disease. Electronically Signed   By: 03/01/2021 M.D.   On: 01/05/2021 21:59    ____________________________________________    PROCEDURES  Procedure(s) performed:    Procedures    Medications  alum & mag hydroxide-simeth (MAALOX/MYLANTA) 200-200-20 MG/5ML suspension 30 mL (30 mLs Oral Given 01/05/21 2254)    And  lidocaine (XYLOCAINE) 2 % viscous mouth solution 15 mL (15 mLs Oral Given 01/05/21 2255)     ____________________________________________   INITIAL IMPRESSION / ASSESSMENT AND PLAN / ED COURSE  Pertinent labs &  imaging results that were available during my care of the patient were reviewed by me and considered in my medical decision making (see chart for details).  Review of the [place] CSRS was performed in accordance of the [facility] prior to dispensing any controlled drugs.           Patient's diagnosis is consistent with globus sensation, GERD.  Patient presented to the emergency department with a sore throat/food impaction sensation.  She still eating and drinking and maintaining oral secretions.  Patient was able to drink a glass of water and take GI cocktail with no difficulty.  GI cocktail has improved symptoms.  Imaging was obtained to ensure no radiopaque foreign body which is not identified.  Given the fact the patient is still eating and drinking I have little concern at  this time for any food impaction.  We will treat for GERD and symptom control with GI cocktail.  Return precautions discussed with the patient.  Follow-up primary care as needed. Patient is given ED precautions to return to the ED for any worsening or new symptoms.     ____________________________________________  FINAL CLINICAL IMPRESSION(S) / ED DIAGNOSES  Final diagnoses:  Gastroesophageal reflux disease, unspecified whether esophagitis present  Globus sensation      NEW MEDICATIONS STARTED DURING THIS VISIT:  ED Discharge Orders          Ordered    omeprazole (PRILOSEC) 10 MG capsule  Daily        01/05/21 2305    GI Cocktail (alum & mag hydroxide-simethicone/lidocaine)oral mixture  3 times daily PRN        01/05/21 2305                This chart was dictated using voice recognition software/Dragon. Despite best efforts to proofread, errors can occur which can change the meaning. Any change was purely unintentional.    Racheal Patches, PA-C 01/05/21 2310    Delton Prairie, MD 01/06/21 0003

## 2021-01-12 DIAGNOSIS — T50995A Adverse effect of other drugs, medicaments and biological substances, initial encounter: Secondary | ICD-10-CM | POA: Diagnosis not present

## 2021-01-12 DIAGNOSIS — M25551 Pain in right hip: Secondary | ICD-10-CM | POA: Diagnosis not present

## 2021-01-12 DIAGNOSIS — L509 Urticaria, unspecified: Secondary | ICD-10-CM | POA: Diagnosis not present

## 2021-01-12 DIAGNOSIS — K219 Gastro-esophageal reflux disease without esophagitis: Secondary | ICD-10-CM | POA: Diagnosis not present

## 2021-01-20 ENCOUNTER — Emergency Department
Admission: EM | Admit: 2021-01-20 | Discharge: 2021-01-20 | Disposition: A | Discharge status: Home / Self Care | Payer: BC Managed Care – PPO | Attending: Emergency Medicine | Admitting: Emergency Medicine

## 2021-01-20 ENCOUNTER — Other Ambulatory Visit: Payer: Self-pay

## 2021-01-20 DIAGNOSIS — X58XXXA Exposure to other specified factors, initial encounter: Secondary | ICD-10-CM | POA: Insufficient documentation

## 2021-01-20 DIAGNOSIS — T782XXA Anaphylactic shock, unspecified, initial encounter: Secondary | ICD-10-CM | POA: Insufficient documentation

## 2021-01-20 DIAGNOSIS — T7840XA Allergy, unspecified, initial encounter: Secondary | ICD-10-CM | POA: Insufficient documentation

## 2021-01-20 MED ORDER — EPINEPHRINE 0.3 MG/0.3ML IJ SOAJ
0.3000 mg | Freq: Once | INTRAMUSCULAR | Status: AC
  Administered 2021-01-20: 0.3 mg via INTRAMUSCULAR
  Filled 2021-01-20: qty 0.3

## 2021-01-20 MED ORDER — DIPHENHYDRAMINE HCL 50 MG/ML IJ SOLN
25.0000 mg | Freq: Once | INTRAMUSCULAR | Status: AC
  Administered 2021-01-20: 25 mg via INTRAVENOUS
  Filled 2021-01-20: qty 1

## 2021-01-20 MED ORDER — EPINEPHRINE 0.3 MG/0.3ML IJ SOAJ: 0.3000 mg | INTRAMUSCULAR | 1 refills | Status: AC | PRN

## 2021-01-20 MED ORDER — METHYLPREDNISOLONE SODIUM SUCC 125 MG IJ SOLR
125.0000 mg | Freq: Once | INTRAMUSCULAR | Status: AC
  Administered 2021-01-20: 125 mg via INTRAVENOUS
  Filled 2021-01-20: qty 2

## 2021-01-20 MED ORDER — PREDNISONE 10 MG PO TABS: ORAL_TABLET | ORAL | 0 refills | Status: AC

## 2021-01-20 MED ORDER — FAMOTIDINE 20 MG IN NS 100 ML IVPB
20.0000 mg | Freq: Once | INTRAVENOUS | Status: AC
  Administered 2021-01-20: 20 mg via INTRAVENOUS
  Filled 2021-01-20: qty 100

## 2021-01-20 NOTE — ED Provider Notes (Signed)
[facility] Emergency Department Provider Note   ____________________________________________   Event Date/Time   First MD Initiated Contact with Patient 01/20/21 1101     (approximate)  I have reviewed the triage vital signs and the nursing notes.   HISTORY  Chief Complaint Allergic Reaction    HPI Leslie Galvan is a 20 y.o. female with no significant past medical history who presents to the ED complaining of allergic reaction.  Patient reports that she has been having waxing and waning symptoms over allergic reaction for the past couple of weeks.  It initially started with diffuse itchy hives, which has seemed to be randomly coming and going over the 2 weeks.  This morning, she noticed increasing swelling around her lips along with a sensation of tightness in her throat.  She states it felt difficult to breathe, although her breathing has now improved.  She denies any lightheadedness, near syncope, vomiting, or diarrhea.  She was prescribed an EpiPen in the past, but states she has never had to use it.  She was seen at urgent care for the symptoms 2 weeks ago, prescribed a course of steroids with which symptoms seem to be improving until she completed steroids a couple of days ago.  It was thought her symptoms were due to starting omeprazole, however she has not taken this medication in at least 1 week but continues to have symptoms.  She denies any new soaps, detergents, or other medications.  She does not take an ACE inhibitor.        Past Medical History:  Diagnosis Date   Allergy    Chlamydia    Genital herpes 07/2020   positive type 2 IgG after resolved lesions   Gestational hypertension    postpartum after G1   PID (acute pelvic inflammatory disease)    Premature rupture of membranes 01/30/2019   Urinary tract infection     Patient Active Problem List   Diagnosis Date Noted   Acute cystitis with hematuria 07/27/2020   Bacterial vaginosis  06/14/2020   Trichomoniasis 03/21/2020   Chlamydia infection 11/06/2019   Gestational hypertension 02/04/2019   Postpartum hypertension 02/04/2019    Past Surgical History:  Procedure Laterality Date   COLONOSCOPY     HYSTEROSCOPY WITH D & C N/A 02/15/2017   Procedure: DILATATION AND CURETTAGE /HYSTEROSCOPY;  Surgeon: Christeen Douglas, MD;  Location: [facility] OR[facility];  Service: Gynecology;  Laterality: N/A;   TONSILLECTOMY N/A 01/04/2016   Procedure: TONSILLECTOMY;  Surgeon: Bud Face, MD;  Location: [facility];  Service: ENT;  Laterality: N/A;   WISDOM TOOTH EXTRACTION      Prior to Admission medications   Medication Sig Start Date End Date Taking? Authorizing Provider  EPINEPHrine 0.3 mg/0.3 mL IJ SOAJ injection Inject 0.3 mg into the muscle as needed for anaphylaxis. 01/20/21  Yes Chesley Noon, MD  predniSONE (DELTASONE) 10 MG tablet Take 6 tablets (60 mg total) by mouth daily for 2 days, THEN 5 tablets (50 mg total) daily for 2 days, THEN 4 tablets (40 mg total) daily for 2 days, THEN 3 tablets (30 mg total) daily for 2 days, THEN 2 tablets (20 mg total) daily for 2 days, THEN 1 tablet (10 mg total) daily for 2 days. 01/20/21 02/01/21 Yes Chesley Noon, MD  GI Cocktail (alum & mag hydroxide-simethicone/lidocaine)oral mixture Take 10 mLs by mouth 3 (three) times daily as needed (sore throat/foreign body sensation). 01/05/21   Cuthriell, Delorise Royals, PA-C  omeprazole (PRILOSEC) 10 MG  capsule Take 1 capsule (10 mg total) by mouth daily. 01/05/21   Cuthriell, Delorise Royals, PA-C  valACYclovir (VALTREX) 500 MG tablet Take 1 tablet (500 mg total) by mouth daily. 07/29/20   Copland, Ilona Sorrel, PA-C    Allergies American cockroach, Dust mite extract, and Molds & smuts  No family history on file.  Social History Social History   Tobacco Use   Smoking status: Never   Smokeless tobacco: Never  Vaping Use   Vaping Use: Never used  Substance Use Topics   Alcohol use: Not Currently     Comment: UNSURE BUT GRANDMOTHER STATES PTS TWIN SISTER SAID SHE HAS DRANK ALCOHOL   Drug use: Not Currently    Types: Marijuana    Comment: PER GRANDMOTHER CHERYL PER PTS TWIN SISTER    Review of Systems  Constitutional: No fever/chills Eyes: No visual changes. ENT: No sore throat.  Positive for lip swelling. Cardiovascular: Denies chest pain. Respiratory: Positive for shortness of breath. Gastrointestinal: No abdominal pain.  No nausea, no vomiting.  No diarrhea.  No constipation. Genitourinary: Negative for dysuria. Musculoskeletal: Negative for back pain. Skin: Positive for rash. Neurological: Negative for headaches, focal weakness or numbness.  ____________________________________________   PHYSICAL EXAM:  VITAL SIGNS: ED Triage Vitals [01/20/21 1055]  Enc Vitals Group     BP      Pulse      Resp      Temp      Temp src      SpO2      Weight 182 lb 11.2 oz (82.9 kg)     Height 5\' 8"  (1.727 m)     Head Circumference      Peak Flow      Pain Score 0     Pain Loc      Pain Edu?      Excl. in GC?     Constitutional: Alert and oriented. Eyes: Conjunctivae are normal. Head: Atraumatic. Nose: No congestion/rhinnorhea. Mouth/Throat: Mucous membranes are moist.  Bilateral upper and lower lip edema with no oropharyngeal edema. Neck: Normal ROM Cardiovascular: Normal rate, regular rhythm. Grossly normal heart sounds. Respiratory: Normal respiratory effort.  No retractions. Lungs CTAB. Gastrointestinal: Soft and nontender. No distention. Genitourinary: deferred Musculoskeletal: No lower extremity tenderness nor edema. Neurologic:  Normal speech and language. No gross focal neurologic deficits are appreciated. Skin:  Skin is warm, dry and intact.  Diffuse hives noted. Psychiatric: Mood and affect are normal. Speech and behavior are normal.  ____________________________________________   LABS (all labs ordered are listed, but only abnormal results are  displayed)  Labs Reviewed - No data to display   PROCEDURES  Procedure(s) performed (including Critical Care):  Procedures   ____________________________________________   INITIAL IMPRESSION / ASSESSMENT AND PLAN / ED COURSE      20 year old female presents to the ED with waxing and waning allergic reaction for the past couple of weeks that became more severe this morning with lip swelling and difficulty breathing.  Presentation is concerning for anaphylaxis and patient was treated with EpiPen, we will also give IV Solu-Medrol, Pepcid, and Benadryl.  Source of her allergic reaction is unclear as she has not had any new medications beyond omeprazole which was stopped 1 week ago.  We will observe here in the ED to ensure symptoms are improving, after which she would be appropriate for discharge home with plan for course of steroids and allergy specialist follow-up.  Symptoms gradually improving here in the ED, no signs of recurrent  anaphylaxis.  Patient is appropriate for discharge home and we will prescribe steroid taper along with EpiPen.  She was provided with referral to follow-up with allergist and counseled to return to the ED for new worsening symptoms.  Patient agrees with plan.      ____________________________________________   FINAL CLINICAL IMPRESSION(S) / ED DIAGNOSES  Final diagnoses:  Anaphylaxis, initial encounter  Allergic reaction, initial encounter     ED Discharge Orders          Ordered    predniSONE (DELTASONE) 10 MG tablet        01/20/21 1443    EPINEPHrine 0.3 mg/0.3 mL IJ SOAJ injection  As needed        01/20/21 1443             Note:  This document was prepared using Dragon voice recognition software and may include unintentional dictation errors.    Chesley Noon, MD 01/20/21 1444

## 2021-01-20 NOTE — ED Triage Notes (Signed)
Pt here with an allergic reaction 2 weeks ago. Pt went to UC and they gave her a steroid shot and some antibiotics. Pt mouth and tongue are swollen and she has hives on her shoulders, thigh area, and her chest. Pt is also having shortness of breath.

## 2021-02-21 ENCOUNTER — Other Ambulatory Visit: Payer: Self-pay | Admitting: Obstetrics and Gynecology

## 2021-02-21 DIAGNOSIS — A6004 Herpesviral vulvovaginitis: Secondary | ICD-10-CM

## 2021-10-23 ENCOUNTER — Other Ambulatory Visit: Payer: Self-pay | Admitting: Obstetrics and Gynecology

## 2021-10-23 DIAGNOSIS — A6004 Herpesviral vulvovaginitis: Secondary | ICD-10-CM

## 2021-12-04 ENCOUNTER — Emergency Department: Payer: Medicaid Other

## 2021-12-04 ENCOUNTER — Emergency Department
Admission: EM | Admit: 2021-12-04 | Discharge: 2021-12-04 | Disposition: A | Discharge status: Home / Self Care | Payer: Medicaid Other | Attending: Emergency Medicine | Admitting: Emergency Medicine

## 2021-12-04 ENCOUNTER — Encounter: Payer: Self-pay | Admitting: Emergency Medicine

## 2021-12-04 ENCOUNTER — Other Ambulatory Visit: Payer: Self-pay

## 2021-12-04 DIAGNOSIS — W01198A Fall on same level from slipping, tripping and stumbling with subsequent striking against other object, initial encounter: Secondary | ICD-10-CM | POA: Insufficient documentation

## 2021-12-04 DIAGNOSIS — S4992XA Unspecified injury of left shoulder and upper arm, initial encounter: Secondary | ICD-10-CM | POA: Diagnosis present

## 2021-12-04 MED ORDER — MELOXICAM 15 MG PO TABS: 15.0000 mg | ORAL_TABLET | Freq: Every day | ORAL | 2 refills | Status: DC

## 2021-12-04 MED ORDER — BACLOFEN 10 MG PO TABS: 10.0000 mg | ORAL_TABLET | Freq: Three times a day (TID) | ORAL | 0 refills | Status: AC

## 2021-12-04 NOTE — ED Triage Notes (Signed)
Pt reports she was in [place] on Thursday and due to the heat she passed out and hit the bathtub with her left shoulder. Pt reports pain radiates to neck, back and side when trying to use it.

## 2021-12-04 NOTE — ED Notes (Signed)
See triage note  Presents with pain

## 2021-12-04 NOTE — Discharge Instructions (Signed)
Follow-up with your regular doctor if not improving in 2 to 3 days.  Follow-up with orthopedics if not better in 1 week.  Take medications as prescribed.  Apply ice to the left shoulder.  Wear the sling to decrease inflammation.

## 2021-12-04 NOTE — ED Provider Triage Note (Signed)
Emergency Medicine Provider Triage Evaluation Note  Leslie Galvan , a 21 y.o. female  was evaluated in triage.  Pt complains of left shoulder pain.  Patient states that she fell in the bathtub while she was in [place] injuring her arm.  She denies any head injury or loss of consciousness.  Review of Systems  Positive: Left shoulder pain. Negative: Negative for head injury  Physical Exam  BP 130/89 (BP Location: Right Arm)   Pulse 67   Temp 98.6 F (37 C) (Oral)   Resp 18   Ht 5\' 8"  (1.727 m)   Wt 79.4 kg   LMP 11/27/2021   SpO2 99%   BMI 26.61 kg/m  Gen:   Awake, no distress   Resp:  Normal effort  MSK:   Decreased movement left shoulder.  No gross deformity. Other:    Medical Decision Making  Medically screening exam initiated at 11:52 AM.  Appropriate orders placed.  Leslie Galvan was informed that the remainder of the evaluation will be completed by another provider, this initial triage assessment does not replace that evaluation, and the importance of remaining in the ED until their evaluation is complete.     Lisette Abu, PA-C 12/04/21 1206

## 2021-12-04 NOTE — ED Provider Notes (Signed)
[facility] Provider Note    Event Date/Time   First MD Initiated Contact with Patient 12/04/21 1219     (approximate)   History   Shoulder Injury (Left)   HPI  Leslie Galvan is a 21 y.o. female with no pertinent past medical history presents to the emergency department after a fall last Thursday while in [place].  Patient states it was hot and she started to pass out while sitting on the toilet.  Larey Seat and hit her shoulder on the tub.  Patient states its normal for her to pass out in the heat.  She denies headache.  Is complaining of left shoulder pain with movement.  No numbness or tingling      Physical Exam   Triage Vital Signs: ED Triage Vitals  Enc Vitals Group     BP 12/04/21 1147 130/89     Pulse Rate 12/04/21 1147 67     Resp 12/04/21 1147 18     Temp 12/04/21 1147 98.6 F (37 C)     Temp Source 12/04/21 1147 Oral     SpO2 12/04/21 1147 99 %     Weight 12/04/21 1148 175 lb (79.4 kg)     Height 12/04/21 1148 5\' 8"  (1.727 m)     Head Circumference --      Peak Flow --      Pain Score 12/04/21 1148 8     Pain Loc --      Pain Edu? --      Excl. in GC? --     Most recent vital signs: Vitals:   12/04/21 1147  BP: 130/89  Pulse: 67  Resp: 18  Temp: 98.6 F (37 C)  SpO2: 99%     General: Awake, no distress.   CV:  Good peripheral perfusion. regular rate and  rhythm Resp:  Normal effort Abd:  No distention.   Other:  Left shoulder mildly tender, pain is reproduced with movement, grips are equal bilaterally, neurovascular is intact   ED Results / Procedures / Treatments   Labs (all labs ordered are listed, but only abnormal results are displayed) Labs Reviewed - No data to display   EKG     RADIOLOGY X-ray of the left shoulder    PROCEDURES:   Procedures   MEDICATIONS ORDERED IN ED: Medications - No data to display   IMPRESSION / MDM / ASSESSMENT AND PLAN / ED COURSE  I reviewed the triage vital signs and  the nursing notes.                              Differential diagnosis includes, but is not limited to, contusion sprain fracture  Patient's presentation is most consistent with acute complicated illness / injury requiring diagnostic workup.   X-ray of the left shoulder individually reviewed and interpreted by me as being normal.  Confirmed by radiology  I did explain these findings to the patient.  She was placed in a sling for comfort.  Given prescription of meloxicam and baclofen.  She is to follow-up with her regular doctor or orthopedics if not improving 1 week.  She is to apply ice.  Patient is in agreement with treatment plan.  Discharged stable condition      FINAL CLINICAL IMPRESSION(S) / ED DIAGNOSES   Final diagnoses:  Injury of left shoulder, initial encounter     Rx / DC Orders   ED Discharge Orders  Ordered    meloxicam (MOBIC) 15 MG tablet  Daily        12/04/21 1248    baclofen (LIORESAL) 10 MG tablet  3 times daily        12/04/21 1248             Note:  This document was prepared using Dragon voice recognition software and may include unintentional dictation errors.    Faythe Ghee, PA-C 12/04/21 1252    Merwyn Katos, MD 12/04/21 [phone]

## 2022-01-04 ENCOUNTER — Ambulatory Visit
Admission: RE | Admit: 2022-01-04 | Discharge: 2022-01-04 | Disposition: A | Discharge status: Home / Self Care | Payer: Medicaid Other | Source: Ambulatory Visit | Attending: Emergency Medicine | Admitting: Emergency Medicine

## 2022-01-04 VITALS — BP 118/77 | HR 101 | Temp 98.6°F | Resp 20

## 2022-01-04 DIAGNOSIS — J069 Acute upper respiratory infection, unspecified: Secondary | ICD-10-CM | POA: Diagnosis not present

## 2022-01-04 LAB — SARS CORONAVIRUS 2 BY RT PCR: SARS Coronavirus 2 by RT PCR: NEGATIVE

## 2022-01-04 LAB — GROUP A STREP BY PCR: Group A Strep by PCR: NOT DETECTED

## 2022-01-04 MED ORDER — PROMETHAZINE-PHENYLEPHRINE 6.25-5 MG/5ML PO SYRP: 5.0000 mL | ORAL_SOLUTION | Freq: Four times a day (QID) | ORAL | 0 refills | Status: DC | PRN

## 2022-01-04 MED ORDER — IPRATROPIUM BROMIDE 0.06 % NA SOLN: 2.0000 | Freq: Four times a day (QID) | NASAL | 12 refills | Status: AC

## 2022-01-04 MED ORDER — BENZONATATE 100 MG PO CAPS: 200.0000 mg | ORAL_CAPSULE | Freq: Three times a day (TID) | ORAL | 0 refills | Status: DC

## 2022-01-04 NOTE — ED Provider Notes (Signed)
[facility]    CSN: [id number] Arrival date & time: 01/04/22  1203      History   Chief Complaint Chief Complaint  Patient presents with   Nasal Congestion   Sore Throat   Headache    HPI Leslie Galvan is a 21 y.o. female.   HPI  21 year old female here for evaluation of respiratory symptoms.  Patient reports that for last 2 days she has been experiencing a headache, runny nose, nasal congestion, sore throat, and a mucousy cough.  Also some shortness of breath and wheezing.  She denies any fever, ear pain, vomiting, or diarrhea.  Her son has similar symptoms.  Past Medical History:  Diagnosis Date   Allergy    Chlamydia    Genital herpes 07/2020   positive type 2 IgG after resolved lesions   Gestational hypertension    postpartum after G1   PID (acute pelvic inflammatory disease)    Premature rupture of membranes 01/30/2019   Urinary tract infection     Patient Active Problem List   Diagnosis Date Noted   Acute cystitis with hematuria 07/27/2020   Bacterial vaginosis 06/14/2020   Trichomoniasis 03/21/2020   Chlamydia infection 11/06/2019   Gestational hypertension 02/04/2019   Postpartum hypertension 02/04/2019    Past Surgical History:  Procedure Laterality Date   COLONOSCOPY     HYSTEROSCOPY WITH D & C N/A 02/15/2017   Procedure: DILATATION AND CURETTAGE /HYSTEROSCOPY;  Surgeon: Christeen Douglas, MD;  Location: [facility];  Service: Gynecology;  Laterality: N/A;   TONSILLECTOMY N/A 01/04/2016   Procedure: TONSILLECTOMY;  Surgeon: Bud Face, MD;  Location: [facility];  Service: ENT;  Laterality: N/A;   WISDOM TOOTH EXTRACTION      OB History     Gravida  1   Para  1   Term  1   Preterm      AB      Living  1      SAB      IAB      Ectopic      Multiple  0   Live Births  1            Home Medications    Prior to Admission medications   Medication Sig Start Date End Date Taking? Authorizing Provider   benzonatate (TESSALON) 100 MG capsule Take 2 capsules (200 mg total) by mouth every 8 (eight) hours. 01/04/22  Yes Becky Augusta, NP  ipratropium (ATROVENT) 0.06 % nasal spray Place 2 sprays into both nostrils 4 (four) times daily. 01/04/22  Yes Becky Augusta, NP  promethazine-phenylephrine (PROMETHAZINE VC) 6.25-5 MG/5ML SYRP Take 5 mLs by mouth every 6 (six) hours as needed for congestion. 01/04/22  Yes Becky Augusta, NP  EPINEPHrine 0.3 mg/0.3 mL IJ SOAJ injection Inject 0.3 mg into the muscle as needed for anaphylaxis. 01/20/21   Chesley Noon, MD  meloxicam (MOBIC) 15 MG tablet Take 1 tablet (15 mg total) by mouth daily. 12/04/21 12/04/22  Fisher, Roselyn Bering, PA-C  omeprazole (PRILOSEC) 10 MG capsule Take 1 capsule (10 mg total) by mouth daily. 01/05/21   Cuthriell, Delorise Royals, PA-C  valACYclovir (VALTREX) 500 MG tablet TAKE 1 TABLET (500 MG TOTAL) BY MOUTH DAILY. 02/22/21   Copland, Ilona Sorrel, PA-C    Family History History reviewed. No pertinent family history.  Social History Social History   Tobacco Use   Smoking status: Never   Smokeless tobacco: Never  Vaping Use   Vaping Use: Never used  Substance Use Topics   Alcohol use: Not Currently    Comment: UNSURE BUT GRANDMOTHER STATES PTS TWIN SISTER SAID SHE HAS DRANK ALCOHOL   Drug use: Not Currently    Types: Marijuana    Comment: PER GRANDMOTHER CHERYL PER PTS TWIN SISTER     Allergies   American cockroach, Dust mite extract, and Molds & smuts   Review of Systems Review of Systems  Constitutional:  Positive for chills. Negative for fever.  HENT:  Positive for congestion, postnasal drip, rhinorrhea and sore throat. Negative for ear pain.   Respiratory:  Positive for cough, shortness of breath and wheezing.   Gastrointestinal:  Negative for diarrhea, nausea and vomiting.  Neurological:  Positive for headaches.  Hematological: Negative.   Psychiatric/Behavioral: Negative.       Physical Exam Triage Vital Signs ED Triage  Vitals [01/04/22 1256]  Enc Vitals Group     BP      Pulse      Resp      Temp      Temp src      SpO2      Weight      Height      Head Circumference      Peak Flow      Pain Score 6     Pain Loc      Pain Edu?      Excl. in GC?    No data found.  Updated Vital Signs BP 118/77 (BP Location: Right Arm)   Pulse (!) 101   Temp 98.6 F (37 C) (Oral)   Resp 20   SpO2 99%   Visual Acuity Right Eye Distance:   Left Eye Distance:   Bilateral Distance:    Right Eye Near:   Left Eye Near:    Bilateral Near:     Physical Exam Vitals and nursing note reviewed.  Constitutional:      Appearance: Normal appearance. She is not ill-appearing.  HENT:     Head: Normocephalic and atraumatic.     Right Ear: Tympanic membrane, ear canal and external ear normal. There is no impacted cerumen.     Left Ear: Tympanic membrane, ear canal and external ear normal. There is no impacted cerumen.     Nose: Congestion and rhinorrhea present.     Mouth/Throat:     Mouth: Mucous membranes are moist.     Pharynx: Oropharynx is clear. Posterior oropharyngeal erythema present. No oropharyngeal exudate.  Cardiovascular:     Rate and Rhythm: Normal rate and regular rhythm.     Pulses: Normal pulses.     Heart sounds: Normal heart sounds. No murmur heard.    No friction rub. No gallop.  Pulmonary:     Effort: Pulmonary effort is normal.     Breath sounds: Normal breath sounds. No wheezing, rhonchi or rales.  Musculoskeletal:     Cervical back: Normal range of motion and neck supple.  Lymphadenopathy:     Cervical: No cervical adenopathy.  Skin:    General: Skin is warm and dry.     Capillary Refill: Capillary refill takes less than 2 seconds.     Findings: No erythema or rash.  Neurological:     General: No focal deficit present.     Mental Status: She is alert and oriented to person, place, and time.  Psychiatric:        Mood and Affect: Mood normal.        Behavior: Behavior normal.  Thought Content: Thought content normal.        Judgment: Judgment normal.      UC Treatments / Results  Labs (all labs ordered are listed, but only abnormal results are displayed) Labs Reviewed  GROUP A STREP BY PCR  SARS CORONAVIRUS 2 BY RT PCR    EKG   Radiology No results found.  Procedures Procedures (including critical care time)  Medications Ordered in UC Medications - No data to display  Initial Impression / Assessment and Plan / UC Course  I have reviewed the triage vital signs and the nursing notes.  Pertinent labs & imaging results that were available during my care of the patient were reviewed by me and considered in my medical decision making (see chart for details).   Patient is a very pleasant, nontoxic-appearing 21 year old female here for evaluation respiratory complaints as outlined in HPI above.  Her physical exam reveals pearly-gray tympanic membranes bilaterally with normal light reflex and clear external auditory canals.  Nasal mucosa is markedly edematous and erythematous with copious clear discharge in both nares.  Oropharyngeal exam reveals surgically absent tonsillar pillars.  Her posterior oropharynx is erythematous and injected with clear postnasal drip.  No anterior cervical lymphadenopathy appreciable exam.  Cardiopulmonary exam reveals S1-S2 heart sounds with regular rate and rhythm and lung sounds that are clear to auscultation in all fields.  Patient's exam is consistent with an upper respiratory infection.  I will swab patient for COVID and strep.  Strep PCR is negative.  COVID PCR is negative.  I will discharge patient home with a diagnosis of viral URI with cough and treat her with Atrovent nasal spray, Tessalon Perles, and Promethazine VC cough syrup.  Tylenol and ibuprofen as needed for pain or fever.  Final Clinical Impressions(s) / UC Diagnoses   Final diagnoses:  Viral URI with cough     Discharge Instructions      Your  test today were negative for both COVID and strep.  I believe you have a viral respiratory infection.  Please use the following medications to help your symptoms.  Use the Atrovent nasal spray, 2 squirts in each nostril every 6 hours, as needed for runny nose and postnasal drip.  Use the Tessalon Perles every 8 hours during the day.  Take them with a small sip of water.  They may give you some numbness to the base of your tongue or a metallic taste in your mouth, this is normal.  Use the Promethazine VC cough syrup at bedtime for cough and congestion.  It will make you drowsy so do not take it during the day.  Return for reevaluation or see your primary care provider for any new or worsening symptoms.      ED Prescriptions     Medication Sig Dispense Auth. Provider   benzonatate (TESSALON) 100 MG capsule Take 2 capsules (200 mg total) by mouth every 8 (eight) hours. 21 capsule Becky Augusta, NP   ipratropium (ATROVENT) 0.06 % nasal spray Place 2 sprays into both nostrils 4 (four) times daily. 15 mL Becky Augusta, NP   promethazine-phenylephrine (PROMETHAZINE VC) 6.25-5 MG/5ML SYRP Take 5 mLs by mouth every 6 (six) hours as needed for congestion. 180 mL Becky Augusta, NP      PDMP not reviewed this encounter.   Becky Augusta, NP 01/04/22 1402

## 2022-01-04 NOTE — Discharge Instructions (Addendum)
Your test today were negative for both COVID and strep.  I believe you have a viral respiratory infection.  Please use the following medications to help your symptoms.  Use the Atrovent nasal spray, 2 squirts in each nostril every 6 hours, as needed for runny nose and postnasal drip.  Use the Tessalon Perles every 8 hours during the day.  Take them with a small sip of water.  They may give you some numbness to the base of your tongue or a metallic taste in your mouth, this is normal.  Use the Promethazine VC cough syrup at bedtime for cough and congestion.  It will make you drowsy so do not take it during the day.  Return for reevaluation or see your primary care provider for any new or worsening symptoms.

## 2022-01-04 NOTE — ED Triage Notes (Signed)
Pt reports nasal congestion, sore throat and headache. Symptoms started 2 days ago. Has tried Dayquil with some relief.

## 2022-08-12 ENCOUNTER — Other Ambulatory Visit: Payer: Self-pay

## 2022-08-12 ENCOUNTER — Emergency Department
Admission: EM | Admit: 2022-08-12 | Discharge: 2022-08-12 | Disposition: A | Discharge status: Home / Self Care | Payer: Medicaid Other | Attending: Emergency Medicine | Admitting: Emergency Medicine

## 2022-08-12 DIAGNOSIS — R197 Diarrhea, unspecified: Secondary | ICD-10-CM | POA: Diagnosis not present

## 2022-08-12 DIAGNOSIS — R112 Nausea with vomiting, unspecified: Secondary | ICD-10-CM | POA: Diagnosis present

## 2022-08-12 LAB — COMPREHENSIVE METABOLIC PANEL
ALT: 11 U/L (ref 0–44)
AST: 20 U/L (ref 15–41)
Albumin: 4.8 g/dL (ref 3.5–5.0)
Alkaline Phosphatase: 71 U/L (ref 38–126)
Anion gap: 10 (ref 5–15)
BUN: 13 mg/dL (ref 6–20)
CO2: 23 mmol/L (ref 22–32)
Calcium: 9.8 mg/dL (ref 8.9–10.3)
Chloride: 106 mmol/L (ref 98–111)
Creatinine, Ser: 0.71 mg/dL (ref 0.44–1.00)
GFR, Estimated: >60 mL/min (ref >60)
Glucose, Bld: 113 mg/dL — ABNORMAL HIGH (ref 70–99)
Potassium: 3.7 mmol/L (ref 3.5–5.1)
Sodium: 139 mmol/L (ref 135–145)
Total Bilirubin: 1.1 mg/dL (ref 0.3–1.2)
Total Protein: 8.8 g/dL — ABNORMAL HIGH (ref 6.5–8.1)

## 2022-08-12 LAB — URINALYSIS, ROUTINE W REFLEX MICROSCOPIC
Bilirubin Urine: NEGATIVE
Glucose, UA: NEGATIVE mg/dL
Hgb urine dipstick: NEGATIVE
Ketones, ur: 20 mg/dL — AB
Leukocytes,Ua: NEGATIVE
Nitrite: NEGATIVE
Protein, ur: 100 mg/dL — AB
Specific Gravity, Urine: 1.031 — ABNORMAL HIGH (ref 1.005–1.030)
pH: 5 (ref 5.0–8.0)

## 2022-08-12 LAB — CBC
HCT: 45.2 % (ref 36.0–46.0)
Hemoglobin: 15 g/dL (ref 12.0–15.0)
MCH: 30.6 pg (ref 26.0–34.0)
MCHC: 33.2 g/dL (ref 30.0–36.0)
MCV: 92.2 fL (ref 80.0–100.0)
Platelets: 208 10*3/uL (ref 150–400)
RBC: 4.9 MIL/uL (ref 3.87–5.11)
RDW: 12.8 % (ref 11.5–15.5)
WBC: 7.2 10*3/uL (ref 4.0–10.5)
nRBC: 0 % (ref 0.0–0.2)

## 2022-08-12 LAB — LIPASE, BLOOD: Lipase: 24 U/L (ref 11–51)

## 2022-08-12 LAB — POC URINE PREG, ED: Preg Test, Ur: NEGATIVE

## 2022-08-12 MED ORDER — LACTATED RINGERS IV BOLUS
1000.0000 mL | Freq: Once | INTRAVENOUS | Status: AC
  Administered 2022-08-12: 1000 mL via INTRAVENOUS

## 2022-08-12 MED ORDER — ONDANSETRON HCL 4 MG/2ML IJ SOLN
4.0000 mg | Freq: Once | INTRAMUSCULAR | Status: AC
  Administered 2022-08-12: 4 mg via INTRAVENOUS
  Filled 2022-08-12: qty 2

## 2022-08-12 MED ORDER — ONDANSETRON 4 MG PO TBDP: 4.0000 mg | ORAL_TABLET | Freq: Three times a day (TID) | ORAL | 0 refills | Status: DC | PRN
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
## 2022-08-12 NOTE — Discharge Instructions (Addendum)
Your blood work is reassuring.  You likely have a virus causing a gastroenteritis.  Make sure you are staying hydrated.  You can take the Zofran for ongoing nausea and vomiting.  Please stick to clear liquids when you are feeling improved you can start having bland solids.  Return to emergency for any worsening abdominal pain.
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
## 2022-11-23 ENCOUNTER — Ambulatory Visit (HOSPITAL_COMMUNITY)
Admission: EM | Admit: 2022-11-23 | Discharge: 2022-11-23 | Disposition: A | Discharge status: Home / Self Care | Payer: Medicaid Other | Attending: Emergency Medicine | Admitting: Emergency Medicine

## 2022-11-23 ENCOUNTER — Encounter (HOSPITAL_COMMUNITY): Payer: Self-pay

## 2022-11-23 DIAGNOSIS — A084 Viral intestinal infection, unspecified: Secondary | ICD-10-CM | POA: Diagnosis not present

## 2022-11-23 MED ORDER — ONDANSETRON 8 MG PO TBDP: 8.0000 mg | ORAL_TABLET | Freq: Three times a day (TID) | ORAL | 0 refills | Status: AC | PRN

## 2022-11-23 NOTE — Discharge Instructions (Addendum)
Overall your symptoms appear to be viral in nature.  You can use the Zofran every 8 hours as needed for nausea and vomiting.  Please ensure you are staying hydrated with at least 64 ounces of water daily.  You can add an electrolyte solution like liquid IV or Pedialyte to this as well.  Please follow a bland diet such as bananas, rice, toast and applesauce.  Avoid fried and spicy foods that will further irritate your gastrointestinal system.  Return to clinic or seek immediate care if you develop loss of consciousness, syncope, blood in your diarrhea or in your vomit, or any new concerning symptoms.

## 2022-11-23 NOTE — ED Provider Notes (Signed)
[facility]    CSN: [id number] Arrival date & time: 11/23/22  1745      History   Chief Complaint Chief Complaint  Patient presents with   Emesis   Diarrhea   Headache    HPI Leslie Galvan is a 22 y.o. female.   Patient presents to clinic for complaints of nausea, vomiting and diarrhea.  Multiple people have been sick recently and her family with the same symptoms.  She has had 2 episodes of diarrhea today, no emesis.  She is feeling nauseous yesterday and she took one of her Zofran, which helped with her nausea.  She did have a cheeseburger today, overall has had a diminished appetite.  She denies any fevers.  Requesting a work note.   The history is provided by the patient and medical records.  Emesis Associated symptoms: abdominal pain and diarrhea   Associated symptoms: no fever   Diarrhea Associated symptoms: abdominal pain and vomiting   Associated symptoms: no fever   Headache Associated symptoms: abdominal pain, diarrhea, nausea and vomiting   Associated symptoms: no fever     Past Medical History:  Diagnosis Date   Allergy    Chlamydia    Genital herpes 07/2020   positive type 2 IgG after resolved lesions   Gestational hypertension    postpartum after G1   PID (acute pelvic inflammatory disease)    Premature rupture of membranes 01/30/2019   Urinary tract infection     Patient Active Problem List   Diagnosis Date Noted   Acute cystitis with hematuria 07/27/2020   Bacterial vaginosis 06/14/2020   Trichomoniasis 03/21/2020   Chlamydia infection 11/06/2019   Gestational hypertension 02/04/2019   Postpartum hypertension 02/04/2019    Past Surgical History:  Procedure Laterality Date   COLONOSCOPY     HYSTEROSCOPY WITH D & C N/A 02/15/2017   Procedure: DILATATION AND CURETTAGE /HYSTEROSCOPY;  Surgeon: Christeen Douglas, MD;  Location: [facility];  Service: Gynecology;  Laterality: N/A;   TONSILLECTOMY N/A 01/04/2016   Procedure:  TONSILLECTOMY;  Surgeon: Bud Face, MD;  Location: [facility];  Service: ENT;  Laterality: N/A;   WISDOM TOOTH EXTRACTION      OB History     Gravida  1   Para  1   Term  1   Preterm      AB      Living  1      SAB      IAB      Ectopic      Multiple  0   Live Births  1            Home Medications    Prior to Admission medications   Medication Sig Start Date End Date Taking? Authorizing Provider  ondansetron (ZOFRAN-ODT) 8 MG disintegrating tablet Take 1 tablet (8 mg total) by mouth every 8 (eight) hours as needed for nausea or vomiting. 11/23/22  Yes Rinaldo Ratel, [place] N, FNP  EPINEPHrine 0.3 mg/0.3 mL IJ SOAJ injection Inject 0.3 mg into the muscle as needed for anaphylaxis. 01/20/21   Chesley Noon, MD  ipratropium (ATROVENT) 0.06 % nasal spray Place 2 sprays into both nostrils 4 (four) times daily. 01/04/22   Becky Augusta, NP    Family History History reviewed. No pertinent family history.  Social History Social History   Tobacco Use   Smoking status: Never   Smokeless tobacco: Never  Vaping Use   Vaping Use: Every day   Substances: Nicotine, Flavoring  Substance Use Topics   Alcohol use: Not Currently    Comment: UNSURE BUT GRANDMOTHER STATES PTS TWIN SISTER SAID SHE HAS DRANK ALCOHOL   Drug use: Not Currently    Types: Marijuana    Comment: PER GRANDMOTHER CHERYL PER PTS TWIN SISTER     Allergies   American cockroach, Dust mite extract, and Molds & smuts   Review of Systems Review of Systems  Constitutional:  Negative for fever.  Gastrointestinal:  Positive for abdominal pain, diarrhea, nausea and vomiting.     Physical Exam Triage Vital Signs ED Triage Vitals  Enc Vitals Group     BP 11/23/22 1826 110/66     Pulse Rate 11/23/22 1826 80     Resp 11/23/22 1826 16     Temp 11/23/22 1826 98.4 F (36.9 C)     Temp Source 11/23/22 1826 Oral     SpO2 11/23/22 1826 98 %     Weight --      Height --      Head  Circumference --      Peak Flow --      Pain Score 11/23/22 1828 4     Pain Loc --      Pain Edu? --      Excl. in [place]? --    No data found.  Updated Vital Signs BP 110/66 (BP Location: Right Arm)   Pulse 80   Temp 98.4 F (36.9 C) (Oral)   Resp 16   LMP 11/20/2022   SpO2 98%   Visual Acuity Right Eye Distance:   Left Eye Distance:   Bilateral Distance:    Right Eye Near:   Left Eye Near:    Bilateral Near:     Physical Exam Vitals and nursing note reviewed.  Constitutional:      Appearance: Normal appearance.  HENT:     Head: Normocephalic and atraumatic.     Right Ear: External ear normal.     Left Ear: External ear normal.     Nose: Nose normal.     Mouth/Throat:     Mouth: Mucous membranes are moist.  Eyes:     Conjunctiva/sclera: Conjunctivae normal.  Cardiovascular:     Rate and Rhythm: Normal rate and regular rhythm.     Heart sounds: Normal heart sounds. No murmur heard. Pulmonary:     Effort: Pulmonary effort is normal. No respiratory distress.     Breath sounds: Normal breath sounds.  Abdominal:     General: Abdomen is flat. Bowel sounds are normal. There is no distension.     Palpations: Abdomen is soft. There is no mass.     Tenderness: There is no abdominal tenderness. There is no guarding or rebound.     Hernia: No hernia is present.  Musculoskeletal:        General: Normal range of motion.  Skin:    General: Skin is warm and dry.  Neurological:     General: No focal deficit present.     Mental Status: She is alert and oriented to person, place, and time.  Psychiatric:        Mood and Affect: Mood normal.      UC Treatments / Results  Labs (all labs ordered are listed, but only abnormal results are displayed) Labs Reviewed - No data to display  EKG   Radiology No results found.  Procedures Procedures (including critical care time)  Medications Ordered in UC Medications - No data to display  Initial Impression /  Assessment  and Plan / UC Course  I have reviewed the triage vital signs and the nursing notes.  Pertinent labs & imaging results that were available during my care of the patient were reviewed by me and considered in my medical decision making (see chart for details).  Vitals and triage reviewed, patient is hemodynamically stable.  Abdomen is soft and nontender with active bowel sounds.  Patient tolerating oral food and fluids.  Recent sick contacts with similar symptoms, suspect viral gastroenteritis.  Symptomatic management and return precautions discussed, no questions at this time.  Work note provided.    Final Clinical Impressions(s) / UC Diagnoses   Final diagnoses:  Viral gastroenteritis     Discharge Instructions      Overall your symptoms appear to be viral in nature.  You can use the Zofran every 8 hours as needed for nausea and vomiting.  Please ensure you are staying hydrated with at least 64 ounces of water daily.  You can add an electrolyte solution like liquid IV or Pedialyte to this as well.  Please follow a bland diet such as bananas, rice, toast and applesauce.  Avoid fried and spicy foods that will further irritate your gastrointestinal system.  Return to clinic or seek immediate care if you develop loss of consciousness, syncope, blood in your diarrhea or in your vomit, or any new concerning symptoms.      ED Prescriptions     Medication Sig Dispense Auth. Provider   ondansetron (ZOFRAN-ODT) 8 MG disintegrating tablet Take 1 tablet (8 mg total) by mouth every 8 (eight) hours as needed for nausea or vomiting. 20 tablet Jerelyn Trimarco, [place] N, [place]      PDMP not reviewed this encounter.   Rodrickus Min, [place] N, [place] 11/23/22 [phone]

## 2022-11-23 NOTE — ED Triage Notes (Signed)
Patient c/o N/V/D and a headache since yesterday.  Patient denies taking any medications for her symptoms.

## 2023-06-16 ENCOUNTER — Encounter (HOSPITAL_COMMUNITY): Payer: Self-pay

## 2023-06-16 ENCOUNTER — Ambulatory Visit (HOSPITAL_COMMUNITY)
Admission: EM | Admit: 2023-06-16 | Discharge: 2023-06-16 | Disposition: A | Discharge status: Home / Self Care | Payer: Medicaid Other | Attending: Physician Assistant | Admitting: Physician Assistant

## 2023-06-16 DIAGNOSIS — B3731 Acute candidiasis of vulva and vagina: Secondary | ICD-10-CM

## 2023-06-16 MED ORDER — FLUCONAZOLE 150 MG PO TABS: 150.0000 mg | ORAL_TABLET | Freq: Every day | ORAL | 0 refills | Status: AC

## 2023-06-16 NOTE — ED Provider Notes (Signed)
[facility]    CSN: [id number] Arrival date & time: 06/16/23  1529      History   Chief Complaint Chief Complaint  Patient presents with   Vaginal Itching    HPI Leslie Galvan is a 23 y.o. female.   Pt complains of vaginal discharge and itching that started about 2 weeks ago.  She reports thicker discharge.  She has tried nothing for the symptoms.  Denies pelvic pain, fever, chills, nausea, vomiting.    Past Medical History:  Diagnosis Date   Allergy    Chlamydia    Genital herpes 07/2020   positive type 2 IgG after resolved lesions   Gestational hypertension    postpartum after G1   PID (acute pelvic inflammatory disease)    Premature rupture of membranes 01/30/2019   Urinary tract infection     Patient Active Problem List   Diagnosis Date Noted   Acute cystitis with hematuria 07/27/2020   Bacterial vaginosis 06/14/2020   Trichomoniasis 03/21/2020   Chlamydia infection 11/06/2019   Gestational hypertension 02/04/2019   Postpartum hypertension 02/04/2019    Past Surgical History:  Procedure Laterality Date   COLONOSCOPY     HYSTEROSCOPY WITH D & C N/A 02/15/2017   Procedure: DILATATION AND CURETTAGE /HYSTEROSCOPY;  Surgeon: Christeen Douglas, MD;  Location: [facility];  Service: Gynecology;  Laterality: N/A;   TONSILLECTOMY N/A 01/04/2016   Procedure: TONSILLECTOMY;  Surgeon: Bud Face, MD;  Location: [facility];  Service: ENT;  Laterality: N/A;   WISDOM TOOTH EXTRACTION      OB History     Gravida  1   Para  1   Term  1   Preterm      AB      Living  1      SAB      IAB      Ectopic      Multiple  0   Live Births  1            Home Medications    Prior to Admission medications   Medication Sig Start Date End Date Taking? Authorizing Provider  fluconazole (DIFLUCAN) 150 MG tablet Take 1 tablet (150 mg total) by mouth daily for 1 day. 06/16/23 06/17/23 Yes Ward, Tylene Fantasia, PA-C  EPINEPHrine 0.3 mg/0.3 mL  IJ SOAJ injection Inject 0.3 mg into the muscle as needed for anaphylaxis. 01/20/21   Chesley Noon, MD  ipratropium (ATROVENT) 0.06 % nasal spray Place 2 sprays into both nostrils 4 (four) times daily. 01/04/22   Becky Augusta, NP  ondansetron (ZOFRAN-ODT) 8 MG disintegrating tablet Take 1 tablet (8 mg total) by mouth every 8 (eight) hours as needed for nausea or vomiting. 11/23/22   Garrison, [place] N, FNP    Family History History reviewed. No pertinent family history.  Social History Social History   Tobacco Use   Smoking status: Never   Smokeless tobacco: Never  Vaping Use   Vaping status: Every Day   Substances: Nicotine, Flavoring  Substance Use Topics   Alcohol use: Not Currently    Comment: UNSURE BUT GRANDMOTHER STATES PTS TWIN SISTER SAID SHE HAS DRANK ALCOHOL   Drug use: Not Currently    Types: Marijuana    Comment: PER GRANDMOTHER CHERYL PER PTS TWIN SISTER     Allergies   American cockroach, Dust mite extract, and Molds & smuts   Review of Systems Review of Systems  Constitutional:  Negative for chills and fever.  HENT:  Negative for ear pain and sore throat.   Eyes:  Negative for pain and visual disturbance.  Respiratory:  Negative for cough and shortness of breath.   Cardiovascular:  Negative for chest pain and palpitations.  Gastrointestinal:  Negative for abdominal pain and vomiting.  Genitourinary:  Positive for vaginal discharge. Negative for dysuria and hematuria.  Musculoskeletal:  Negative for arthralgias and back pain.  Skin:  Negative for color change and rash.  Neurological:  Negative for seizures and syncope.  All other systems reviewed and are negative.    Physical Exam Triage Vital Signs ED Triage Vitals  Encounter Vitals Group     BP 06/16/23 1609 (!) 142/70     Systolic BP Percentile --      Diastolic BP Percentile --      Pulse Rate 06/16/23 1607 78     Resp 06/16/23 1607 18     Temp 06/16/23 1607 99.3 F (37.4 C)     Temp Source  06/16/23 1607 Oral     SpO2 06/16/23 1607 98 %     Weight --      Height --      Head Circumference --      Peak Flow --      Pain Score 06/16/23 1609 0     Pain Loc --      Pain Education --      Exclude from Growth Chart --    No data found.  Updated Vital Signs BP (!) 142/70   Pulse 78   Temp 99.3 F (37.4 C) (Oral)   Resp 16   LMP 05/24/2023 (Approximate)   SpO2 98%   Visual Acuity Right Eye Distance:   Left Eye Distance:   Bilateral Distance:    Right Eye Near:   Left Eye Near:    Bilateral Near:     Physical Exam Vitals and nursing note reviewed.  Constitutional:      General: She is not in acute distress.    Appearance: She is well-developed.  HENT:     Head: Normocephalic and atraumatic.  Eyes:     Conjunctiva/sclera: Conjunctivae normal.  Cardiovascular:     Rate and Rhythm: Normal rate and regular rhythm.     Heart sounds: No murmur heard. Pulmonary:     Effort: Pulmonary effort is normal. No respiratory distress.     Breath sounds: Normal breath sounds.  Abdominal:     Palpations: Abdomen is soft.     Tenderness: There is no abdominal tenderness.  Musculoskeletal:        General: No swelling.     Cervical back: Neck supple.  Skin:    General: Skin is warm and dry.     Capillary Refill: Capillary refill takes less than 2 seconds.  Neurological:     Mental Status: She is alert.  Psychiatric:        Mood and Affect: Mood normal.      UC Treatments / Results  Labs (all labs ordered are listed, but only abnormal results are displayed) Labs Reviewed  CERVICOVAGINAL ANCILLARY ONLY    EKG   Radiology No results found.  Procedures Procedures (including critical care time)  Medications Ordered in UC Medications - No data to display  Initial Impression / Assessment and Plan / UC Course  I have reviewed the triage vital signs and the nursing notes.  Pertinent labs & imaging results that were available during my care of the patient  were reviewed by me and considered  in my medical decision making (see chart for details).     Will treat for vaginal yeast infection.  Diflucan prescribed.  Cervicovaginal self swab in clinic today, pending results.  Will change treatment plan based on these results. Final Clinical Impressions(s) / UC Diagnoses   Final diagnoses:  Vaginal yeast infection     Discharge Instructions      Take diflucan as prescribed Will call with test results and change treatment plan if indicated    ED Prescriptions     Medication Sig Dispense Auth. Provider   fluconazole (DIFLUCAN) 150 MG tablet Take 1 tablet (150 mg total) by mouth daily for 1 day. 1 tablet Ward, Tylene Fantasia, PA-C      PDMP not reviewed this encounter.   Ward, Tylene Fantasia, PA-C 06/16/23 1733

## 2023-06-16 NOTE — Discharge Instructions (Signed)
Take diflucan as prescribed Will call with test results and change treatment plan if indicated

## 2023-06-16 NOTE — ED Triage Notes (Signed)
Patient states vaginal itching and discharge x 2 weeks.

## 2023-06-17 ENCOUNTER — Telehealth (HOSPITAL_BASED_OUTPATIENT_CLINIC_OR_DEPARTMENT_OTHER): Payer: Self-pay

## 2023-06-17 LAB — CERVICOVAGINAL ANCILLARY ONLY
Bacterial Vaginitis (gardnerella): POSITIVE — AB
Candida Glabrata: NEGATIVE
Candida Vaginitis: NEGATIVE
Chlamydia: NEGATIVE
Comment: NEGATIVE
Comment: NEGATIVE
Comment: NEGATIVE
Comment: NEGATIVE
Comment: NEGATIVE
Comment: NORMAL
Neisseria Gonorrhea: NEGATIVE
Trichomonas: NEGATIVE

## 2023-06-17 MED ORDER — METRONIDAZOLE 500 MG PO TABS: 500.0000 mg | ORAL_TABLET | Freq: Two times a day (BID) | ORAL | 0 refills | Status: AC

## 2023-06-17 NOTE — Telephone Encounter (Signed)
Per protocol, pt requires tx with metronidazole. Rx sent to pharmacy on file.

## 2023-12-02 ENCOUNTER — Encounter: Payer: Self-pay | Admitting: Emergency Medicine

## 2023-12-02 ENCOUNTER — Ambulatory Visit
Admission: EM | Admit: 2023-12-02 | Discharge: 2023-12-02 | Disposition: A | Discharge status: Home / Self Care | Attending: Emergency Medicine | Admitting: Emergency Medicine

## 2023-12-02 DIAGNOSIS — L02214 Cutaneous abscess of groin: Secondary | ICD-10-CM

## 2023-12-02 MED ORDER — CEPHALEXIN 500 MG PO CAPS: 500.0000 mg | ORAL_CAPSULE | Freq: Four times a day (QID) | ORAL | 0 refills | Status: AC

## 2023-12-02 NOTE — ED Provider Notes (Signed)
 [name]    CSN: [id number] Arrival date & time: 12/02/23  1504      History   Chief Complaint Chief Complaint  Patient presents with   Cyst    HPI Leslie Galvan is a 23 y.o. female.   Patient presents for evaluation of abscess present to right groin beginning 2 to 3 days ago, progressively worsening as it has become erythematous painful and swollen.  Swelling extending into the right thigh.  Has not attempted treatment.  Denies drainage.  Has had occurred before but never to this extent.  Past Medical History:  Diagnosis Date   Allergy    Chlamydia    Genital herpes 07/2020   positive type 2 IgG after resolved lesions   Gestational hypertension    postpartum after G1   PID (acute pelvic inflammatory disease)    Premature rupture of membranes 01/30/2019   Urinary tract infection     Patient Active Problem List   Diagnosis Date Noted   Acute cystitis with hematuria 07/27/2020   Bacterial vaginosis 06/14/2020   Trichomoniasis 03/21/2020   Chlamydia infection 11/06/2019   Gestational hypertension 02/04/2019   Postpartum hypertension 02/04/2019    Past Surgical History:  Procedure Laterality Date   COLONOSCOPY     HYSTEROSCOPY WITH D & C N/A 02/15/2017   Procedure: DILATATION AND CURETTAGE /HYSTEROSCOPY;  Surgeon: Verdon Keen, MD;  Location: [facility];  Service: Gynecology;  Laterality: N/A;   TONSILLECTOMY N/A 01/04/2016   Procedure: TONSILLECTOMY;  Surgeon: Carolee Hunter, MD;  Location: [facility];  Service: ENT;  Laterality: N/A;   WISDOM TOOTH EXTRACTION      OB History     Gravida  1   Para  1   Term  1   Preterm      AB      Living  1      SAB      IAB      Ectopic      Multiple  0   Live Births  1            Home Medications    Prior to Admission medications   Medication Sig Start Date End Date Taking? Authorizing Provider  cephALEXin  (KEFLEX ) 500 MG capsule Take 1 capsule (500 mg total) by mouth 4  (four) times daily. 12/02/23  Yes Valyncia Wiens R, NP  EPINEPHrine  0.3 mg/0.3 mL IJ SOAJ injection Inject 0.3 mg into the muscle as needed for anaphylaxis. 01/20/21   Jessup, Charles, MD  ipratropium (ATROVENT ) 0.06 % nasal spray Place 2 sprays into both nostrils 4 (four) times daily. 01/04/22   Bernardino Ditch, NP  ondansetron  (ZOFRAN -ODT) 8 MG disintegrating tablet Take 1 tablet (8 mg total) by mouth every 8 (eight) hours as needed for nausea or vomiting. 11/23/22   [name], [name]  N, FNP    Family History History reviewed. No pertinent family history.  Social History Social History   Tobacco Use   Smoking status: Never   Smokeless tobacco: Never  Vaping Use   Vaping status: Every Day   Substances: Nicotine, Flavoring  Substance Use Topics   Alcohol use: Not Currently    Comment: UNSURE BUT GRANDMOTHER STATES PTS TWIN SISTER SAID SHE HAS DRANK ALCOHOL   Drug use: Not Currently    Types: Marijuana    Comment: PER GRANDMOTHER CHERYL PER PTS TWIN SISTER     Allergies   American cockroach, Dust mite extract, and Molds & smuts   Review of Systems  Review of Systems   Physical Exam Triage Vital Signs ED Triage Vitals  Encounter Vitals Group     BP 12/02/23 1542 (!) 108/54     Girls Systolic BP Percentile --      Girls Diastolic BP Percentile --      Boys Systolic BP Percentile --      Boys Diastolic BP Percentile --      Pulse Rate 12/02/23 1542 74     Resp 12/02/23 1542 18     Temp 12/02/23 1542 98.3 F (36.8 C)     Temp Source 12/02/23 1542 Oral     SpO2 12/02/23 1542 99 %     Weight --      Height --      Head Circumference --      Peak Flow --      Pain Score 12/02/23 1545 8     Pain Loc --      Pain Education --      Exclude from Growth Chart --    No data found.  Updated Vital Signs BP (!) 108/54 (BP Location: Left Arm)   Pulse 74   Temp 98.3 F (36.8 C) (Oral)   Resp 18   LMP 11/30/2023   SpO2 99%   Visual Acuity Right Eye Distance:   Left Eye  Distance:   Bilateral Distance:    Right Eye Near:   Left Eye Near:    Bilateral Near:     Physical Exam Constitutional:      Appearance: Normal appearance.  Eyes:     Extraocular Movements: Extraocular movements intact.  Pulmonary:     Effort: Pulmonary effort is normal.  Skin:    Comments: 2x 3 erythematous and tender abscess present to the right groin  Neurological:     Mental Status: She is alert and oriented to person, place, and time. Mental status is at baseline.      UC Treatments / Results  Labs (all labs ordered are listed, but only abnormal results are displayed) Labs Reviewed - No data to display  EKG   Radiology No results found.  Procedures Incision and Drainage  Date/Time: 12/02/2023 4:25 PM  Performed by: Teresa Shelba SAUNDERS, NP Authorized by: Teresa Shelba SAUNDERS, NP   Consent:    Consent obtained:  Verbal   Consent given by:  Patient   Risks, benefits, and alternatives were discussed: yes     Risks discussed:  Incomplete drainage Universal protocol:    Procedure explained and questions answered to patient or proxy's satisfaction: yes     Patient identity confirmed:  Verbally with patient Location:    Type:  Abscess   Size:  2x3   Location: right groin. Pre-procedure details:    Skin preparation:  Chlorhexidine with alcohol Sedation:    Sedation type:  None Anesthesia:    Anesthesia method:  Local infiltration   Local anesthetic:  Lidocaine  1% w/o epi Procedure type:    Complexity:  Simple Procedure details:    Ultrasound guidance: no     Incision types:  Single straight   Drainage:  Purulent   Drainage amount:  Moderate   Wound treatment:  Wound left open   Packing materials:  None Post-procedure details:    Procedure completion:  Tolerated  (including critical care time)  Medications Ordered in UC Medications - No data to display  Initial Impression / Assessment and Plan / UC Course  I have reviewed the triage vital signs and  the nursing  notes.  Pertinent labs & imaging results that were available during my care of the patient were reviewed by me and considered in my medical decision making (see chart for details).  Abscess of right groin  I&D completed, tolerated well, prescribed cephalexin  and discussed administration recommended supportive care through warm compresses and over-the-counter analgesics and advised to follow-up for any delays in healing Final Clinical Impressions(s) / UC Diagnoses   Final diagnoses:  Abscess of right groin     Discharge Instructions      Abscess has been drained to help release pressure and ideally while you may experience some soreness pain should not be as severe  Take Keflex  and every 6 hours for 5 days  Hold warm-hot compresses to affected area at least 4 times a day, this helps to facilitate draining, the more the better  Please return for evaluation for increased swelling, increased tenderness or pain, non healing site, non draining site, you begin to have fever or chills   We reviewed the etiology of recurrent abscesses of skin.  Skin abscesses are collections of pus within the dermis and deeper skin tissues. Skin abscesses manifest as painful, tender, fluctuant, and erythematous nodules, frequently surmounted by a pustule and surrounded by a rim of erythematous swelling.  Spontaneous drainage of purulent material may occur.  Fever can occur on occasion.    -Skin abscesses can develop in healthy individuals with no predisposing conditions other than skin or nasal carriage of Staphylococcus aureus.  Individuals in close contact with others who have active infection with skin abscesses are at increased risk which is likely to explain why twin brother has similar episodes.   In addition, any process leading to a breach in the skin barrier can also predispose to the development of a skin abscesses, such as atopic dermatitis.      ED Prescriptions     Medication Sig  Dispense Auth. Provider   cephALEXin  (KEFLEX ) 500 MG capsule Take 1 capsule (500 mg total) by mouth 4 (four) times daily. 20 capsule Azara Gemme R, NP      PDMP not reviewed this encounter.   Teresa Shelba SAUNDERS, NP 12/02/23 1625

## 2023-12-02 NOTE — ED Triage Notes (Signed)
 Patient reports cyst in right groin area. No drainage. Rates pain 8/10.

## 2023-12-02 NOTE — Discharge Instructions (Signed)
 Abscess has been drained to help release pressure and ideally while you may experience some soreness pain should not be as severe  Take Keflex  and every 6 hours for 5 days  Hold warm-hot compresses to affected area at least 4 times a day, this helps to facilitate draining, the more the better  Please return for evaluation for increased swelling, increased tenderness or pain, non healing site, non draining site, you begin to have fever or chills   We reviewed the etiology of recurrent abscesses of skin.  Skin abscesses are collections of pus within the dermis and deeper skin tissues. Skin abscesses manifest as painful, tender, fluctuant, and erythematous nodules, frequently surmounted by a pustule and surrounded by a rim of erythematous swelling.  Spontaneous drainage of purulent material may occur.  Fever can occur on occasion.    -Skin abscesses can develop in healthy individuals with no predisposing conditions other than skin or nasal carriage of Staphylococcus aureus.  Individuals in close contact with others who have active infection with skin abscesses are at increased risk which is likely to explain why twin brother has similar episodes.   In addition, any process leading to a breach in the skin barrier can also predispose to the development of a skin abscesses, such as atopic dermatitis.

## 2024-03-24 ENCOUNTER — Ambulatory Visit
Admission: EM | Admit: 2024-03-24 | Discharge: 2024-03-24 | Disposition: A | Discharge status: Home / Self Care | Attending: Emergency Medicine | Admitting: Emergency Medicine

## 2024-03-24 DIAGNOSIS — R300 Dysuria: Secondary | ICD-10-CM | POA: Insufficient documentation

## 2024-03-24 DIAGNOSIS — N898 Other specified noninflammatory disorders of vagina: Secondary | ICD-10-CM | POA: Diagnosis not present

## 2024-03-24 LAB — POCT URINE DIPSTICK
Glucose, UA: NEGATIVE mg/dL
Ketones, POC UA: NEGATIVE mg/dL
Nitrite, UA: NEGATIVE
POC PROTEIN,UA: 100 — AB
Spec Grav, UA: 1.025 (ref 1.010–1.025)
Urobilinogen, UA: 1 U/dL
pH, UA: 7 (ref 5.0–8.0)

## 2024-03-24 MED ORDER — NITROFURANTOIN MONOHYD MACRO 100 MG PO CAPS: 100.0000 mg | ORAL_CAPSULE | Freq: Two times a day (BID) | ORAL | 0 refills | Status: AC

## 2024-03-24 MED ORDER — FLUCONAZOLE 150 MG PO TABS: 150.0000 mg | ORAL_TABLET | ORAL | 0 refills | Status: AC

## 2024-03-24 MED ORDER — METRONIDAZOLE 500 MG PO TABS: 500.0000 mg | ORAL_TABLET | Freq: Two times a day (BID) | ORAL | 0 refills | Status: AC

## 2024-03-24 NOTE — Discharge Instructions (Signed)
 Today you are evaluated for urinary and vaginal symptoms  Urinalysis shows Garan Frappier blood cells but does not show bacteria, Sample has been sent to the lab for 3 days to see if bacteria will grow, you will be notified if this occurs  You will be started on an antibiotic for potential urinary infection as you are symptomatic  Begin Macrobid  twice daily for 7 days  Vaginal swab checking for yeast, bacterial vaginosis, gonorrhea chlamydia and trichomoniasis, pending for 2 to 3 days and you will be notified of positive test results only  Based on your symptoms you are being treated for yeast and bacterial vaginosis most likely triggered from recent menstruation  Take metronidazole  twice daily for 7 days for treatment of bacterial vaginosis  Take 1 Diflucan  tablet and in 1 week after completing all antibiotics take second dose  Today you are being treated prophylactically for yeast.   Take diflucan  150 mg once, if symptoms still present in 3 days then you may take second pill   In addition:   Avoid baths, hot tubs and whirlpool spas.  Don't use scented or harsh soaps, such as those with deodorant or antibacterial action. Avoid irritants. These include scented tampons and pads. Wipe from front to back after using the toilet.  Don't douche. Your vagina doesn't require cleansing other than normal bathing.  Use a  condom. Wear cotton underwear, this fabric helps absorb moisture

## 2024-03-24 NOTE — ED Provider Notes (Signed)
 Leslie Galvan    CSN: [id number] Arrival date & time: 03/24/24  1457      History   Chief Complaint No chief complaint on file.   HPI Leslie Galvan is a 23 y.o. female.   Patient presents for evaluation of urinary frequency, urgency, incomplete bladder emptying, dysuria, intermittent lower abdominal and low back pain, thin vaginal discharge with itching and odor beginning 3 days ago.  Symptoms started after conclusion of recent menstruation.  Has attempted use of an over-the-counter medicine with minimal improvement.  Denies known exposures.  Past Medical History:  Diagnosis Date   Allergy    Chlamydia    Genital herpes 07/2020   positive type 2 IgG after resolved lesions   Gestational hypertension    postpartum after G1   PID (acute pelvic inflammatory disease)    Premature rupture of membranes 01/30/2019   Urinary tract infection     Patient Active Problem List   Diagnosis Date Noted   Acute cystitis with hematuria 07/27/2020   Bacterial vaginosis 06/14/2020   Trichomoniasis 03/21/2020   Chlamydia infection 11/06/2019   Gestational hypertension 02/04/2019   Postpartum hypertension 02/04/2019    Past Surgical History:  Procedure Laterality Date   COLONOSCOPY     HYSTEROSCOPY WITH D & C N/A 02/15/2017   Procedure: DILATATION AND CURETTAGE /HYSTEROSCOPY;  Surgeon: Verdon Keen, MD;  Location: [facility];  Service: Gynecology;  Laterality: N/A;   TONSILLECTOMY N/A 01/04/2016   Procedure: TONSILLECTOMY;  Surgeon: Carolee Hunter, MD;  Location: [facility];  Service: ENT;  Laterality: N/A;   WISDOM TOOTH EXTRACTION      OB History     Gravida  1   Para  1   Term  1   Preterm      AB      Living  1      SAB      IAB      Ectopic      Multiple  0   Live Births  1            Home Medications    Prior to Admission medications   Medication Sig Start Date End Date Taking? Authorizing Provider  cephALEXin  (KEFLEX ) 500 MG  capsule Take 1 capsule (500 mg total) by mouth 4 (four) times daily. 12/02/23   Teresa Shelba SAUNDERS, NP  EPINEPHrine  0.3 mg/0.3 mL IJ SOAJ injection Inject 0.3 mg into the muscle as needed for anaphylaxis. 01/20/21   [name], MD  fluconazole  (DIFLUCAN ) 150 MG tablet Take 1 tablet (150 mg total) by mouth once a week for 2 doses. 03/24/24 04/01/24 Yes [name], NP  ipratropium (ATROVENT ) 0.06 % nasal spray Place 2 sprays into both nostrils 4 (four) times daily. 01/04/22   Bernardino Ditch, NP  metroNIDAZOLE  (FLAGYL ) 500 MG tablet Take 1 tablet (500 mg total) by mouth 2 (two) times daily. 03/24/24  Yes [name], NP  nitrofurantoin , macrocrystal-monohydrate, (MACROBID ) 100 MG capsule Take 1 capsule (100 mg total) by mouth 2 (two) times daily. 03/24/24  Yes [name], NP  ondansetron  (ZOFRAN -ODT) 8 MG disintegrating tablet Take 1 tablet (8 mg total) by mouth every 8 (eight) hours as needed for nausea or vomiting. 11/23/22   [name], [name]  N, FNP    Family History No family history on file.  Social History Social History   Tobacco Use   Smoking status: Never   Smokeless tobacco: Never  Vaping Use   Vaping status: Every Day  Substances: Nicotine, Flavoring  Substance Use Topics   Alcohol use: Not Currently    Comment: UNSURE BUT GRANDMOTHER STATES PTS TWIN SISTER SAID SHE HAS DRANK ALCOHOL   Drug use: Not Currently    Types: Marijuana    Comment: PER GRANDMOTHER CHERYL PER PTS TWIN SISTER     Allergies   American cockroach, Dust mite extract, and Molds & smuts   Review of Systems Review of Systems   Physical Exam Triage Vital Signs ED Triage Vitals  Encounter Vitals Group     BP      Girls Systolic BP Percentile      Girls Diastolic BP Percentile      Boys Systolic BP Percentile      Boys Diastolic BP Percentile      Pulse      Resp      Temp      Temp src      SpO2      Weight      Height      Head Circumference      Peak Flow      Pain Score       Pain Loc      Pain Education      Exclude from Growth Chart    No data found.  Updated Vital Signs There were no vitals taken for this visit.  Visual Acuity Right Eye Distance:   Left Eye Distance:   Bilateral Distance:    Right Eye Near:   Left Eye Near:    Bilateral Near:     Physical Exam Constitutional:      Appearance: Normal appearance.  Eyes:     Extraocular Movements: Extraocular movements intact.  Pulmonary:     Effort: Pulmonary effort is normal.  Abdominal:     General: Abdomen is flat.     Tenderness: There is abdominal tenderness in the suprapubic area. There is no right CVA tenderness or left CVA tenderness.  Neurological:     Mental Status: She is alert and oriented to person, place, and time. Mental status is at baseline.      UC Treatments / Results  Labs (all labs ordered are listed, but only abnormal results are displayed) Labs Reviewed  URINE CULTURE  POCT URINE DIPSTICK  CERVICOVAGINAL ANCILLARY ONLY    EKG   Radiology No results found.  Procedures Procedures (including critical care time)  Medications Ordered in UC Medications - No data to display  Initial Impression / Assessment and Plan / UC Course  I have reviewed the triage vital signs and the nursing notes.  Pertinent labs & imaging results that were available during my care of the patient were reviewed by me and considered in my medical decision making (see chart for details).  Vaginal discharge, dysuria  Urinalysis showed leukocytes, negative for nitrates, sent for culture, discussed findings with patient empirically placed on Macrobid , based on symptomology also treating for BV and yeast, metronidazole  prescribed as well as Diflucan  and discussed administration, STI labs pending will treat further per protocol, advised abstaining from alcohol and any form of sexual intercourse during treatment until all labs result and symptoms have resolved, recommended  nonpharmacological supportive care with follow-up as needed Final Clinical Impressions(s) / UC Diagnoses   Final diagnoses:  Dysuria  Vaginal discharge     Discharge Instructions      Today you are evaluated for urinary and vaginal symptoms  Urinalysis shows [name] blood cells but does not show bacteria, Sample has  been sent to the lab for 3 days to see if bacteria will grow, you will be notified if this occurs  You will be started on an antibiotic for potential urinary infection as you are symptomatic  Begin Macrobid  twice daily for 7 days  Vaginal swab checking for yeast, bacterial vaginosis, gonorrhea chlamydia and trichomoniasis, pending for 2 to 3 days and you will be notified of positive test results only  Based on your symptoms you are being treated for yeast and bacterial vaginosis most likely triggered from recent menstruation  Take metronidazole  twice daily for 7 days for treatment of bacterial vaginosis  Take 1 Diflucan  tablet and in 1 week after completing all antibiotics take second dose  Today you are being treated prophylactically for yeast.   Take diflucan  150 mg once, if symptoms still present in 3 days then you may take second pill   In addition:   Avoid baths, hot tubs and whirlpool spas.  Don't use scented or harsh soaps, such as those with deodorant or antibacterial action. Avoid irritants. These include scented tampons and pads. Wipe from front to back after using the toilet.  Don't douche. Your vagina doesn't require cleansing other than normal bathing.  Use a  condom. Wear cotton underwear, this fabric helps absorb moisture        ED Prescriptions     Medication Sig Dispense Auth. Provider   metroNIDAZOLE  (FLAGYL ) 500 MG tablet Take 1 tablet (500 mg total) by mouth 2 (two) times daily. 14 tablet Somaly Marteney R, NP   nitrofurantoin , macrocrystal-monohydrate, (MACROBID ) 100 MG capsule Take 1 capsule (100 mg total) by mouth 2 (two) times  daily. 10 capsule Charlize Hathaway R, NP   fluconazole  (DIFLUCAN ) 150 MG tablet Take 1 tablet (150 mg total) by mouth once a week for 2 doses. 2 tablet Libia Fazzini R, NP      PDMP not reviewed this encounter.   Teresa Shelba SAUNDERS, NP 03/24/24 1554

## 2024-03-25 ENCOUNTER — Ambulatory Visit (HOSPITAL_COMMUNITY): Payer: Self-pay

## 2024-03-25 LAB — CERVICOVAGINAL ANCILLARY ONLY
Bacterial Vaginitis (gardnerella): POSITIVE — AB
Candida Glabrata: NEGATIVE
Candida Vaginitis: NEGATIVE
Chlamydia: NEGATIVE
Comment: NEGATIVE
Comment: NEGATIVE
Comment: NEGATIVE
Comment: NEGATIVE
Comment: NEGATIVE
Comment: NORMAL
Neisseria Gonorrhea: NEGATIVE
Trichomonas: NEGATIVE

## 2024-03-27 LAB — URINE CULTURE: Culture: >=100000 — AB
